# Patient Record
Sex: Female | Born: 1955 | Race: White | Hispanic: No | Smoking: Never smoker
Health system: Southern US, Community
[De-identification: ages and names within clinical notes are randomized; demographics above are authoritative.]

## PROBLEM LIST (undated history)

## (undated) DIAGNOSIS — R296 Repeated falls: Secondary | ICD-10-CM

## (undated) DIAGNOSIS — E7800 Pure hypercholesterolemia, unspecified: Secondary | ICD-10-CM

## (undated) DIAGNOSIS — I959 Hypotension, unspecified: Secondary | ICD-10-CM

## (undated) DIAGNOSIS — O2230 Deep phlebothrombosis in pregnancy, unspecified trimester: Secondary | ICD-10-CM

## (undated) DIAGNOSIS — F32A Depression, unspecified: Secondary | ICD-10-CM

## (undated) DIAGNOSIS — S42309A Unspecified fracture of shaft of humerus, unspecified arm, initial encounter for closed fracture: Secondary | ICD-10-CM

## (undated) DIAGNOSIS — K469 Unspecified abdominal hernia without obstruction or gangrene: Secondary | ICD-10-CM

## (undated) DIAGNOSIS — K259 Gastric ulcer, unspecified as acute or chronic, without hemorrhage or perforation: Secondary | ICD-10-CM

## (undated) DIAGNOSIS — F419 Anxiety disorder, unspecified: Secondary | ICD-10-CM

## (undated) DIAGNOSIS — G4700 Insomnia, unspecified: Secondary | ICD-10-CM

## (undated) DIAGNOSIS — IMO0002 Reserved for concepts with insufficient information to code with codable children: Secondary | ICD-10-CM

## (undated) DIAGNOSIS — K219 Gastro-esophageal reflux disease without esophagitis: Secondary | ICD-10-CM

## (undated) DIAGNOSIS — F329 Major depressive disorder, single episode, unspecified: Secondary | ICD-10-CM

## (undated) HISTORY — PX: TUBAL LIGATION: SHX77

## (undated) HISTORY — PX: BACK SURGERY: SHX140

## (undated) HISTORY — PX: OTHER SURGICAL HISTORY: SHX169

## (undated) HISTORY — PX: FOOT SURGERY: SHX648

## (undated) HISTORY — PX: KNEE SURGERY: SHX244

## (undated) HISTORY — PX: ABDOMINAL HYSTERECTOMY: SHX81

---

## 2001-07-01 ENCOUNTER — Encounter: Payer: Self-pay | Admitting: Orthopedic Surgery

## 2001-07-01 ENCOUNTER — Inpatient Hospital Stay (HOSPITAL_COMMUNITY): Admission: EM | Admit: 2001-07-01 | Discharge: 2001-07-05 | Payer: Self-pay | Admitting: Emergency Medicine

## 2001-09-27 DIAGNOSIS — F329 Major depressive disorder, single episode, unspecified: Secondary | ICD-10-CM | POA: Insufficient documentation

## 2002-04-24 ENCOUNTER — Emergency Department (HOSPITAL_COMMUNITY): Admission: EM | Admit: 2002-04-24 | Discharge: 2002-04-24 | Payer: Self-pay | Admitting: Emergency Medicine

## 2003-01-05 ENCOUNTER — Emergency Department (HOSPITAL_COMMUNITY): Admission: EM | Admit: 2003-01-05 | Discharge: 2003-01-05 | Payer: Self-pay | Admitting: Emergency Medicine

## 2004-04-28 ENCOUNTER — Encounter (HOSPITAL_COMMUNITY): Admission: RE | Admit: 2004-04-28 | Discharge: 2004-07-27 | Payer: Self-pay | Admitting: Internal Medicine

## 2004-07-01 ENCOUNTER — Emergency Department (HOSPITAL_COMMUNITY): Admission: EM | Admit: 2004-07-01 | Discharge: 2004-07-01 | Payer: Self-pay | Admitting: Family Medicine

## 2004-07-09 ENCOUNTER — Emergency Department (HOSPITAL_COMMUNITY): Admission: EM | Admit: 2004-07-09 | Discharge: 2004-07-09 | Payer: Self-pay | Admitting: Emergency Medicine

## 2004-07-20 ENCOUNTER — Encounter: Admission: RE | Admit: 2004-07-20 | Discharge: 2004-07-20 | Payer: Self-pay | Admitting: Sports Medicine

## 2004-08-14 ENCOUNTER — Ambulatory Visit (HOSPITAL_COMMUNITY): Admission: RE | Admit: 2004-08-14 | Discharge: 2004-08-14 | Payer: Self-pay | Admitting: Sports Medicine

## 2004-08-19 ENCOUNTER — Ambulatory Visit (HOSPITAL_COMMUNITY): Admission: RE | Admit: 2004-08-19 | Discharge: 2004-08-19 | Payer: Self-pay | Admitting: Interventional Radiology

## 2004-08-19 ENCOUNTER — Encounter (INDEPENDENT_AMBULATORY_CARE_PROVIDER_SITE_OTHER): Payer: Self-pay | Admitting: Specialist

## 2005-01-17 ENCOUNTER — Ambulatory Visit: Payer: Self-pay | Admitting: Psychiatry

## 2005-01-17 ENCOUNTER — Emergency Department (HOSPITAL_COMMUNITY): Admission: EM | Admit: 2005-01-17 | Discharge: 2005-01-17 | Payer: Self-pay | Admitting: Emergency Medicine

## 2005-01-17 ENCOUNTER — Inpatient Hospital Stay (HOSPITAL_COMMUNITY): Admission: AD | Admit: 2005-01-17 | Discharge: 2005-01-20 | Payer: Self-pay | Admitting: Psychiatry

## 2005-01-18 ENCOUNTER — Ambulatory Visit (HOSPITAL_COMMUNITY): Admission: RE | Admit: 2005-01-18 | Discharge: 2005-01-18 | Payer: Self-pay | Admitting: Psychiatry

## 2005-04-15 ENCOUNTER — Encounter: Admission: RE | Admit: 2005-04-15 | Discharge: 2005-04-15 | Payer: Self-pay | Admitting: Sports Medicine

## 2005-06-25 ENCOUNTER — Ambulatory Visit: Payer: Self-pay | Admitting: Internal Medicine

## 2005-07-01 ENCOUNTER — Ambulatory Visit: Payer: Self-pay | Admitting: Psychiatry

## 2005-07-01 ENCOUNTER — Emergency Department (HOSPITAL_COMMUNITY): Admission: EM | Admit: 2005-07-01 | Discharge: 2005-07-01 | Payer: Self-pay | Admitting: Emergency Medicine

## 2005-07-01 ENCOUNTER — Inpatient Hospital Stay (HOSPITAL_COMMUNITY): Admission: RE | Admit: 2005-07-01 | Discharge: 2005-07-03 | Payer: Self-pay | Admitting: Psychiatry

## 2005-07-09 ENCOUNTER — Inpatient Hospital Stay (HOSPITAL_COMMUNITY): Admission: RE | Admit: 2005-07-09 | Discharge: 2005-07-11 | Payer: Self-pay | Admitting: Orthopedic Surgery

## 2005-09-28 ENCOUNTER — Encounter: Admission: RE | Admit: 2005-09-28 | Discharge: 2005-09-28 | Payer: Self-pay | Admitting: Sports Medicine

## 2006-09-13 ENCOUNTER — Ambulatory Visit: Payer: Self-pay | Admitting: Pulmonary Disease

## 2006-09-13 ENCOUNTER — Inpatient Hospital Stay (HOSPITAL_COMMUNITY): Admission: AC | Admit: 2006-09-13 | Discharge: 2006-09-19 | Payer: Self-pay

## 2006-09-21 ENCOUNTER — Ambulatory Visit: Payer: Self-pay | Admitting: Gastroenterology

## 2010-04-26 ENCOUNTER — Emergency Department (HOSPITAL_COMMUNITY): Admission: EM | Admit: 2010-04-26 | Discharge: 2010-04-26 | Payer: Self-pay | Admitting: Emergency Medicine

## 2010-05-28 DIAGNOSIS — Z95818 Presence of other cardiac implants and grafts: Secondary | ICD-10-CM

## 2010-06-09 ENCOUNTER — Ambulatory Visit: Payer: Self-pay | Admitting: Gastroenterology

## 2010-06-09 ENCOUNTER — Inpatient Hospital Stay (HOSPITAL_COMMUNITY)
Admission: EM | Admit: 2010-06-09 | Discharge: 2010-06-30 | Disposition: A | Discharge status: Other Acute Inpatient Hospital | Payer: Self-pay | Source: Home / Self Care | Admitting: Emergency Medicine

## 2010-06-09 ENCOUNTER — Ambulatory Visit: Payer: Self-pay | Admitting: Internal Medicine

## 2010-06-13 ENCOUNTER — Ambulatory Visit: Payer: Self-pay | Admitting: Surgery

## 2010-06-15 ENCOUNTER — Encounter (INDEPENDENT_AMBULATORY_CARE_PROVIDER_SITE_OTHER): Payer: Self-pay | Admitting: Internal Medicine

## 2010-06-20 ENCOUNTER — Encounter: Payer: Self-pay | Admitting: Gastroenterology

## 2010-06-30 ENCOUNTER — Ambulatory Visit: Payer: Self-pay | Admitting: Psychiatry

## 2010-06-30 ENCOUNTER — Inpatient Hospital Stay (HOSPITAL_COMMUNITY): Admission: AD | Admit: 2010-06-30 | Discharge: 2010-07-13 | Payer: Self-pay | Admitting: Psychiatry

## 2010-07-03 DIAGNOSIS — K921 Melena: Secondary | ICD-10-CM

## 2010-07-04 ENCOUNTER — Ambulatory Visit: Payer: Self-pay | Admitting: Vascular Surgery

## 2010-07-04 ENCOUNTER — Emergency Department (HOSPITAL_COMMUNITY): Admission: EM | Admit: 2010-07-04 | Discharge: 2010-07-04 | Payer: Self-pay | Admitting: Emergency Medicine

## 2010-07-04 ENCOUNTER — Encounter (INDEPENDENT_AMBULATORY_CARE_PROVIDER_SITE_OTHER): Payer: Self-pay | Admitting: Emergency Medicine

## 2010-07-04 DIAGNOSIS — I82409 Acute embolism and thrombosis of unspecified deep veins of unspecified lower extremity: Secondary | ICD-10-CM | POA: Insufficient documentation

## 2010-07-06 ENCOUNTER — Telehealth (INDEPENDENT_AMBULATORY_CARE_PROVIDER_SITE_OTHER): Payer: Self-pay | Admitting: *Deleted

## 2010-07-15 ENCOUNTER — Ambulatory Visit: Payer: Self-pay | Admitting: Internal Medicine

## 2010-07-15 ENCOUNTER — Encounter (INDEPENDENT_AMBULATORY_CARE_PROVIDER_SITE_OTHER): Payer: Self-pay | Admitting: Nurse Practitioner

## 2010-07-20 ENCOUNTER — Ambulatory Visit: Payer: Self-pay | Admitting: Nurse Practitioner

## 2010-07-20 DIAGNOSIS — Z9889 Other specified postprocedural states: Secondary | ICD-10-CM | POA: Insufficient documentation

## 2010-07-20 DIAGNOSIS — E8941 Symptomatic postprocedural ovarian failure: Secondary | ICD-10-CM

## 2010-07-29 ENCOUNTER — Encounter (INDEPENDENT_AMBULATORY_CARE_PROVIDER_SITE_OTHER): Payer: Self-pay | Admitting: Internal Medicine

## 2010-07-29 ENCOUNTER — Encounter (INDEPENDENT_AMBULATORY_CARE_PROVIDER_SITE_OTHER): Payer: Self-pay | Admitting: Nurse Practitioner

## 2010-07-31 ENCOUNTER — Encounter (INDEPENDENT_AMBULATORY_CARE_PROVIDER_SITE_OTHER): Payer: Self-pay | Admitting: Nurse Practitioner

## 2010-08-07 ENCOUNTER — Encounter: Payer: Self-pay | Admitting: Nurse Practitioner

## 2010-08-07 LAB — CONVERTED CEMR LAB: INR: 2.53 — ABNORMAL HIGH (ref <1.50)

## 2010-08-10 ENCOUNTER — Telehealth (INDEPENDENT_AMBULATORY_CARE_PROVIDER_SITE_OTHER): Payer: Self-pay | Admitting: Nurse Practitioner

## 2010-08-11 ENCOUNTER — Ambulatory Visit: Payer: Self-pay | Admitting: Psychiatry

## 2010-08-14 ENCOUNTER — Telehealth (INDEPENDENT_AMBULATORY_CARE_PROVIDER_SITE_OTHER): Payer: Self-pay | Admitting: Nurse Practitioner

## 2010-08-14 ENCOUNTER — Encounter (INDEPENDENT_AMBULATORY_CARE_PROVIDER_SITE_OTHER): Payer: Self-pay | Admitting: Nurse Practitioner

## 2010-08-17 ENCOUNTER — Telehealth (INDEPENDENT_AMBULATORY_CARE_PROVIDER_SITE_OTHER): Payer: Self-pay | Admitting: Nurse Practitioner

## 2010-08-19 ENCOUNTER — Encounter (INDEPENDENT_AMBULATORY_CARE_PROVIDER_SITE_OTHER): Payer: Self-pay | Admitting: Nurse Practitioner

## 2010-08-24 ENCOUNTER — Encounter: Payer: Self-pay | Admitting: Nurse Practitioner

## 2010-08-24 ENCOUNTER — Telehealth (INDEPENDENT_AMBULATORY_CARE_PROVIDER_SITE_OTHER): Payer: Self-pay | Admitting: Nurse Practitioner

## 2010-08-25 ENCOUNTER — Ambulatory Visit: Payer: Self-pay | Admitting: Nurse Practitioner

## 2010-08-25 DIAGNOSIS — R197 Diarrhea, unspecified: Secondary | ICD-10-CM

## 2010-08-25 DIAGNOSIS — E860 Dehydration: Secondary | ICD-10-CM

## 2010-08-25 LAB — CONVERTED CEMR LAB
Glucose, Urine, Semiquant: NEGATIVE
Protein, U semiquant: >=300
Urobilinogen, UA: 0.2
pH: 6

## 2010-09-08 ENCOUNTER — Encounter (INDEPENDENT_AMBULATORY_CARE_PROVIDER_SITE_OTHER): Payer: Self-pay | Admitting: Family Medicine

## 2010-09-08 ENCOUNTER — Ambulatory Visit: Payer: Self-pay | Admitting: Nurse Practitioner

## 2010-09-08 LAB — CONVERTED CEMR LAB

## 2010-09-09 ENCOUNTER — Encounter: Payer: Self-pay | Admitting: Gastroenterology

## 2010-09-10 ENCOUNTER — Encounter (INDEPENDENT_AMBULATORY_CARE_PROVIDER_SITE_OTHER): Payer: Self-pay | Admitting: Family Medicine

## 2010-09-11 ENCOUNTER — Encounter (INDEPENDENT_AMBULATORY_CARE_PROVIDER_SITE_OTHER): Payer: Self-pay | Admitting: Nurse Practitioner

## 2010-09-11 ENCOUNTER — Emergency Department (HOSPITAL_COMMUNITY)
Admission: EM | Admit: 2010-09-11 | Discharge: 2010-09-11 | Payer: Self-pay | Source: Home / Self Care | Admitting: Emergency Medicine

## 2010-09-11 ENCOUNTER — Encounter (INDEPENDENT_AMBULATORY_CARE_PROVIDER_SITE_OTHER): Payer: Self-pay | Admitting: Family Medicine

## 2010-09-11 LAB — CONVERTED CEMR LAB: Prothrombin Time: 97.1 s — ABNORMAL HIGH (ref 11.6–15.2)

## 2010-09-12 ENCOUNTER — Emergency Department (HOSPITAL_COMMUNITY)
Admission: EM | Admit: 2010-09-12 | Discharge: 2010-09-12 | Payer: Self-pay | Source: Home / Self Care | Admitting: Emergency Medicine

## 2010-09-17 ENCOUNTER — Encounter (INDEPENDENT_AMBULATORY_CARE_PROVIDER_SITE_OTHER): Payer: Self-pay | Admitting: Nurse Practitioner

## 2010-09-22 ENCOUNTER — Encounter: Payer: Self-pay | Admitting: Nurse Practitioner

## 2010-09-23 ENCOUNTER — Telehealth (INDEPENDENT_AMBULATORY_CARE_PROVIDER_SITE_OTHER): Payer: Self-pay | Admitting: Nurse Practitioner

## 2010-10-18 ENCOUNTER — Encounter: Payer: Self-pay | Admitting: Internal Medicine

## 2010-10-24 IMAGING — CT CT HEAD W/O CM
4 of 7 series · 15 of 47 positions shown, 17 images · non-contrast
Comparison: Unenhanced cranial CT 07/01/2005 and 01/18/2005.

CT HEAD

CLINICAL DATA: Patient found in the woods after having been
missing the past 9 days.  Poorly responsive.  Now intubated.

CT HEAD WITHOUT CONTRAST
CT CERVICAL SPINE WITHOUT CONTRAST
TECHNIQUE: Multidetector CT imaging of the head and cervical spine
was performed following the standard protocol without intravenous
contrast.  Multiplanar CT image reconstructions of the cervical
spine were also generated.

[Series 3: recon 2: brain · axial · 0.47mm/px · z∈[-67,+46]mm · 6 of 72 slices shown, 8 images]
[im 11/72  brain]
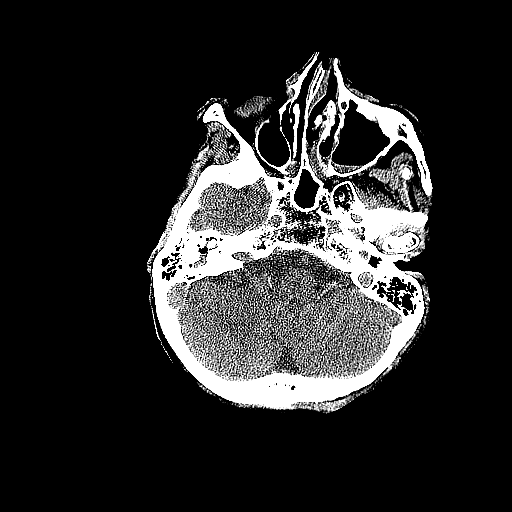
[im 11/72  bone]
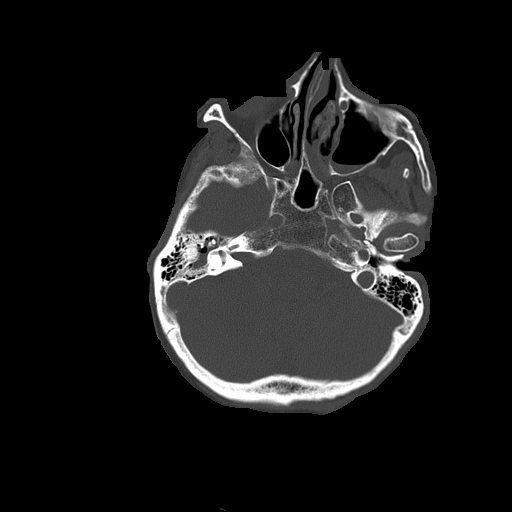
[im 21/72  brain]
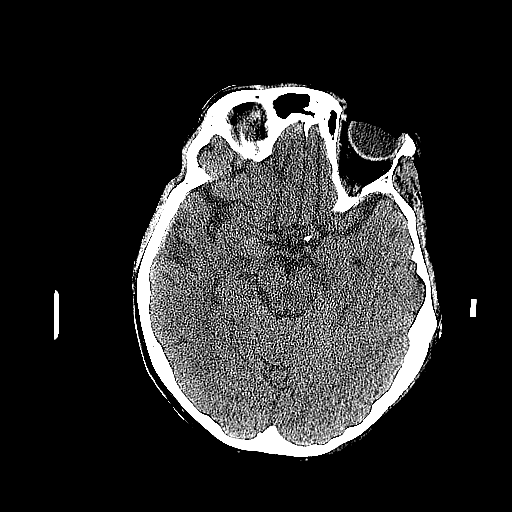
[im 31/72  brain]
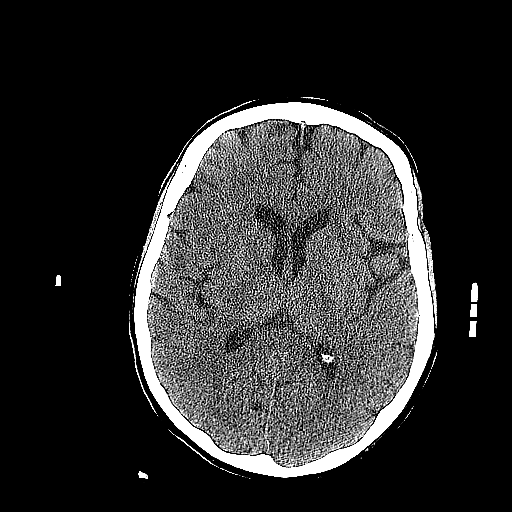
[im 41/72  brain]
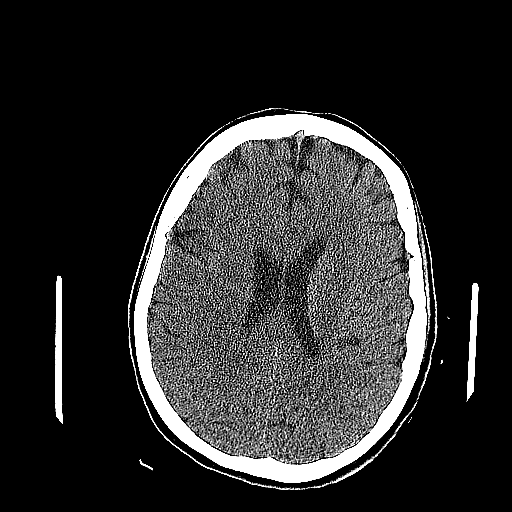
[im 51/72  brain]
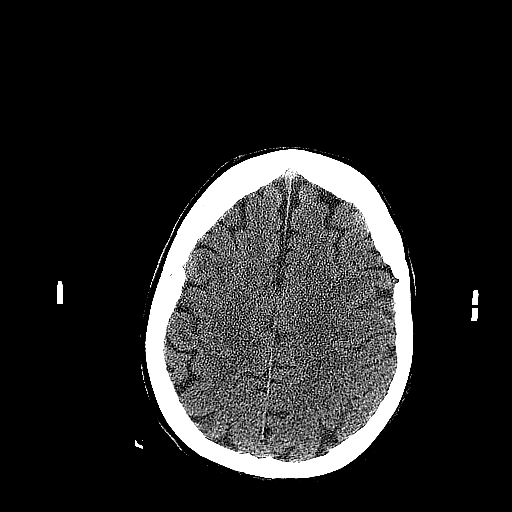
[im 51/72  bone]
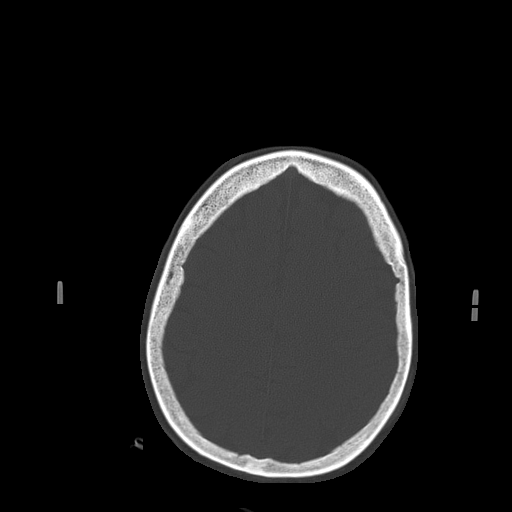
[im 61/72  brain]
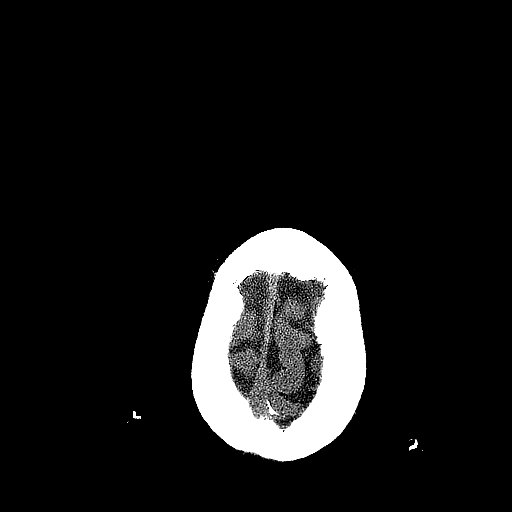

[Series 600: sagittal · sagittal · 0.33mm/px · 3 of 29 slices shown]
[im 10/29  brain]
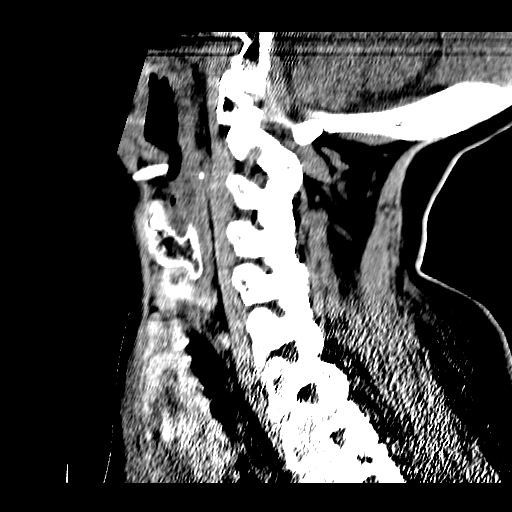
[im 15/29  brain]
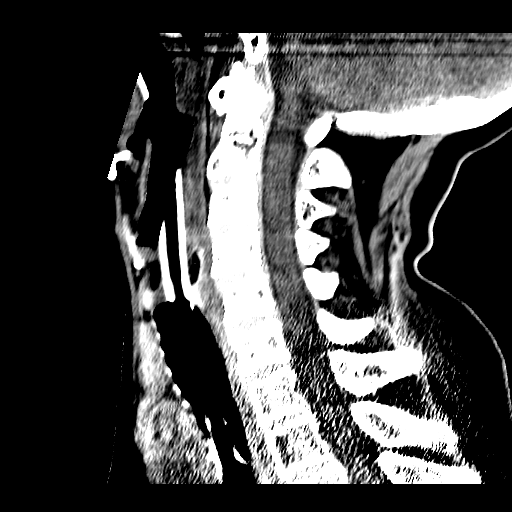
[im 19/29  brain]
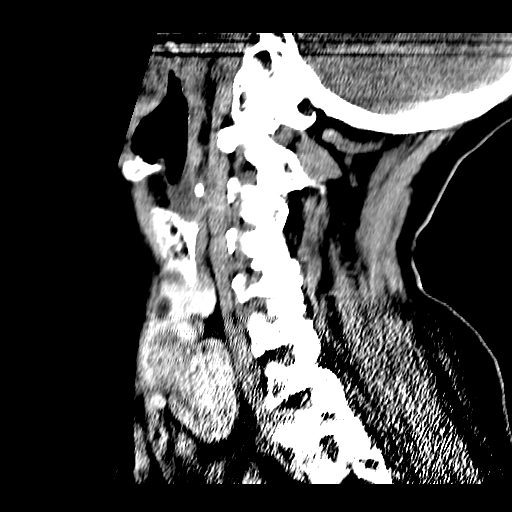

[Series 601: coronal · coronal · 0.33mm/px · 3 of 30 slices shown]
[im 10/30  brain]
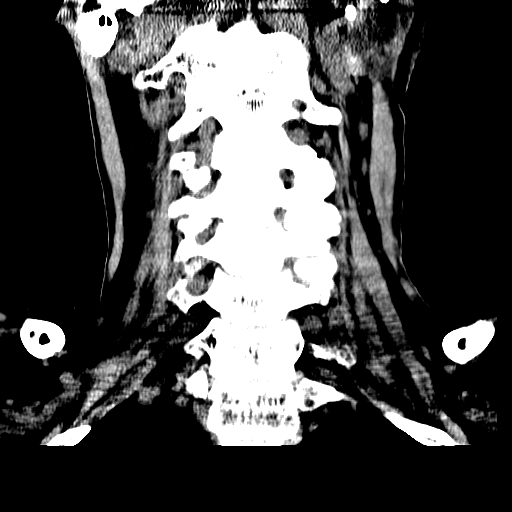
[im 13/30  brain]
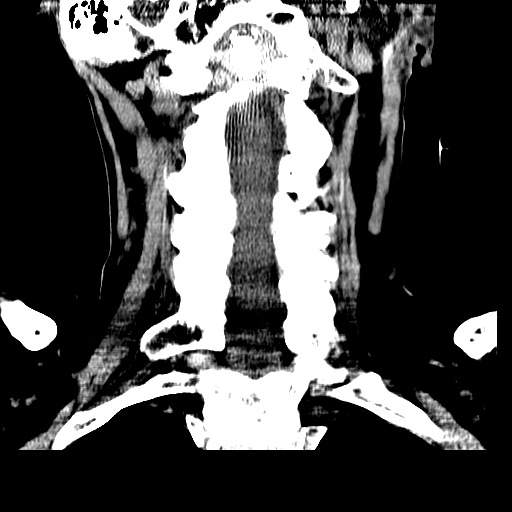
[im 17/30  brain]
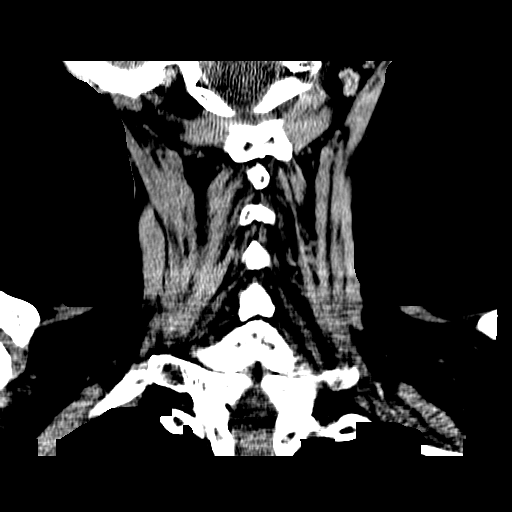

[Series 602: orthogonal · axial · 0.33mm/px · z∈[-265,-195]mm · 3 of 52 slices shown]
[im 13/52  brain]
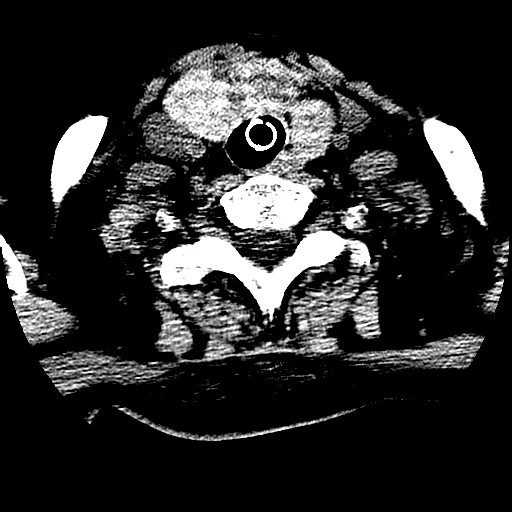
[im 26/52  brain]
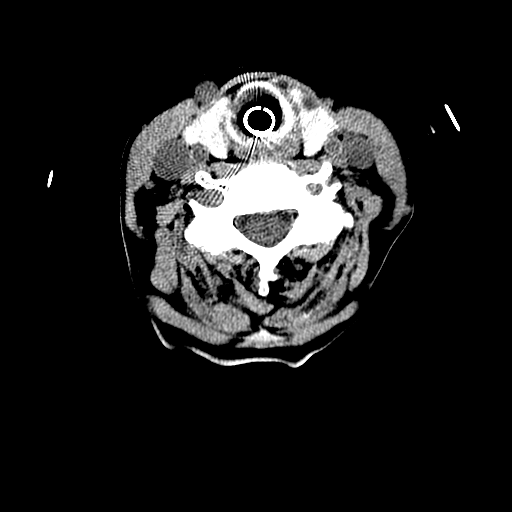
[im 39/52  brain]
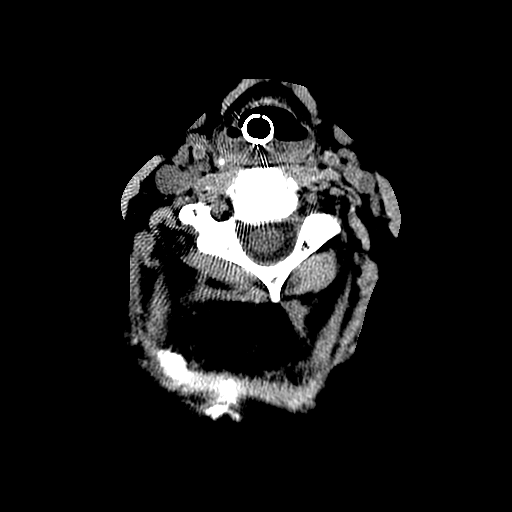

[15 of 47 positions shown; findings below may reference images not displayed]

FINDINGS: Head tilt in the gantry accounts for apparent asymmetry
in the cerebral hemispheres.  Ventricular system normal in size and
appearance for age.  No mass lesion.  No midline shift.  No acute
hemorrhage or hematoma.  No extra-axial fluid collections.  No
evidence of acute infarction.  No significant interval change in
the appearance of brain.

No skull fractures or other focal osseous abnormalities involving
the skull.  Opacification of bilateral ethmoid air cells.  Air-
fluid level in the left maxillary sinus and associated mucous
retention cyst or polyp.  Fluid in both middle ear cavities.
Severe bony nasal septal deviation to the right.  Mastoid air cells
well-aerated.  Early minimal bilateral carotid siphon
atherosclerosis.
IMPRESSION: 1.  No acute intracranial abnormality.
2.  Acute superimposed upon chronic left maxillary sinusitis.
Chronic bilateral ethmoid sinusitis.
3.  Bilateral otitis media.

CT CERVICAL SPINE
FINDINGS: Soft tissue window images demonstrate multinodular
goiter.  No gross cervical spine disc protrusions.  No fractures
identified involving the cervical spine.  Head rotated to the left.
Sagittal reconstructed images demonstrate anatomic posterior
alignment.  Disc spaces well preserved.  No spinal stenosis.  Facet
joints intact with diffuse degenerative changes.  Coronal
reformatted images demonstrate intact craniocervical junction,
intact C1-C2 articulation, intact dens, and intact lateral masses
throughout.  Predominately facet hypertrophy accounts for
multilevel foraminal stenoses including severe right and moderate
left C3-4, severe left and moderate right C4-5, and mild bilateral
C5-6.
IMPRESSION: 1.  No cervical spine fractures.
2.  Diffuse facet degenerative changes resulting in multilevel
foraminal stenoses as detailed above.
3.  Multinodular goiter.

## 2010-10-24 IMAGING — CR DG HIP COMPLETE 2+V*R*
3 series · 3 of 3 positions shown · non-contrast
Comparison: None

CLINICAL DATA: Right hip pain.

RIGHT HIP - COMPLETE 2+ VIEW

[AP (1 of 3)]
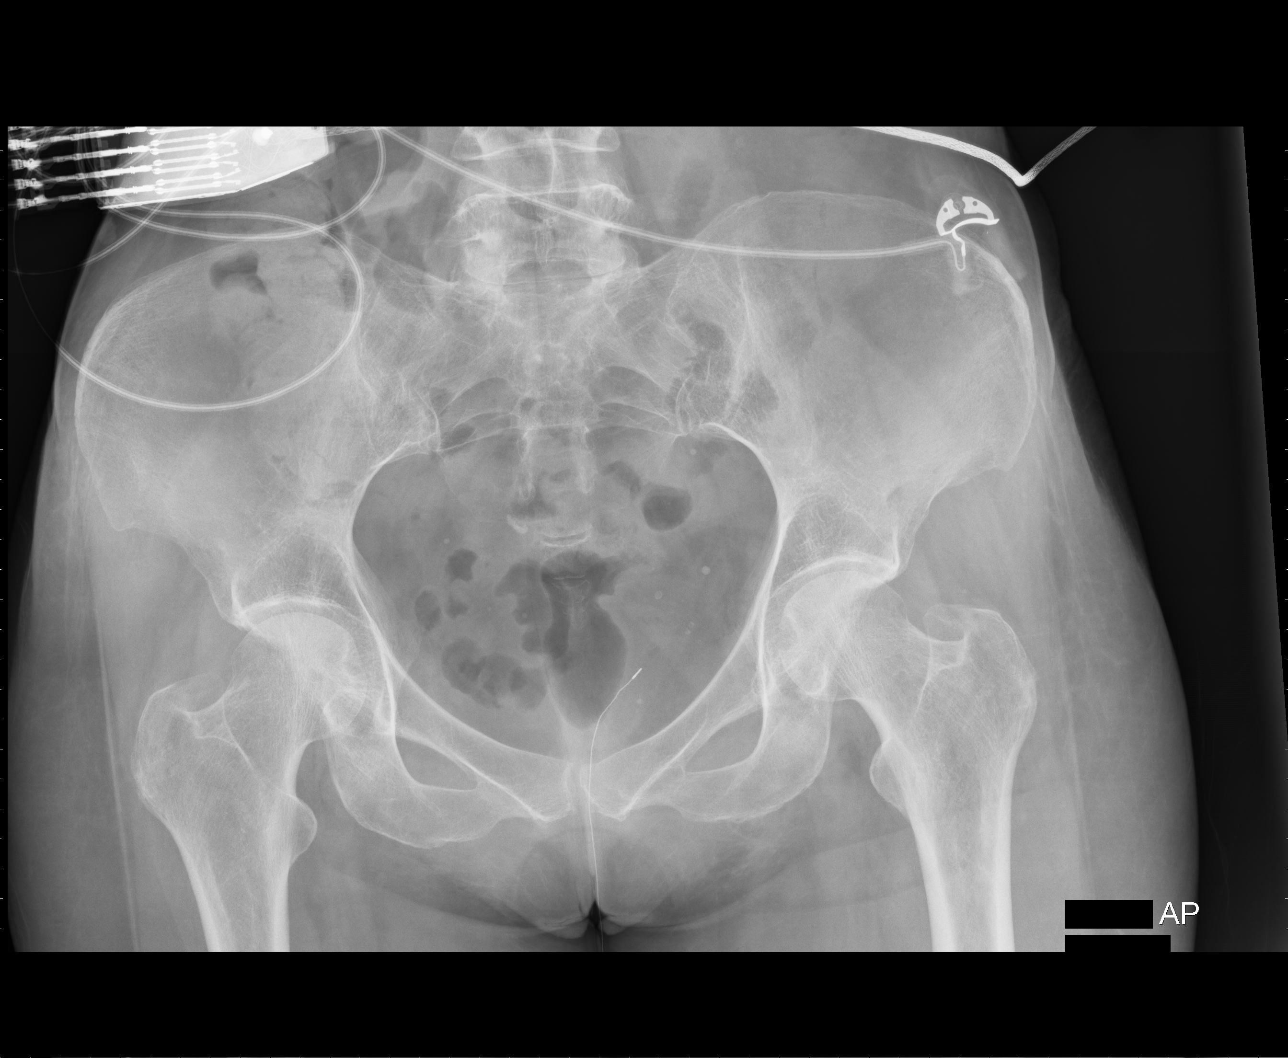

[AP (2 of 3)]
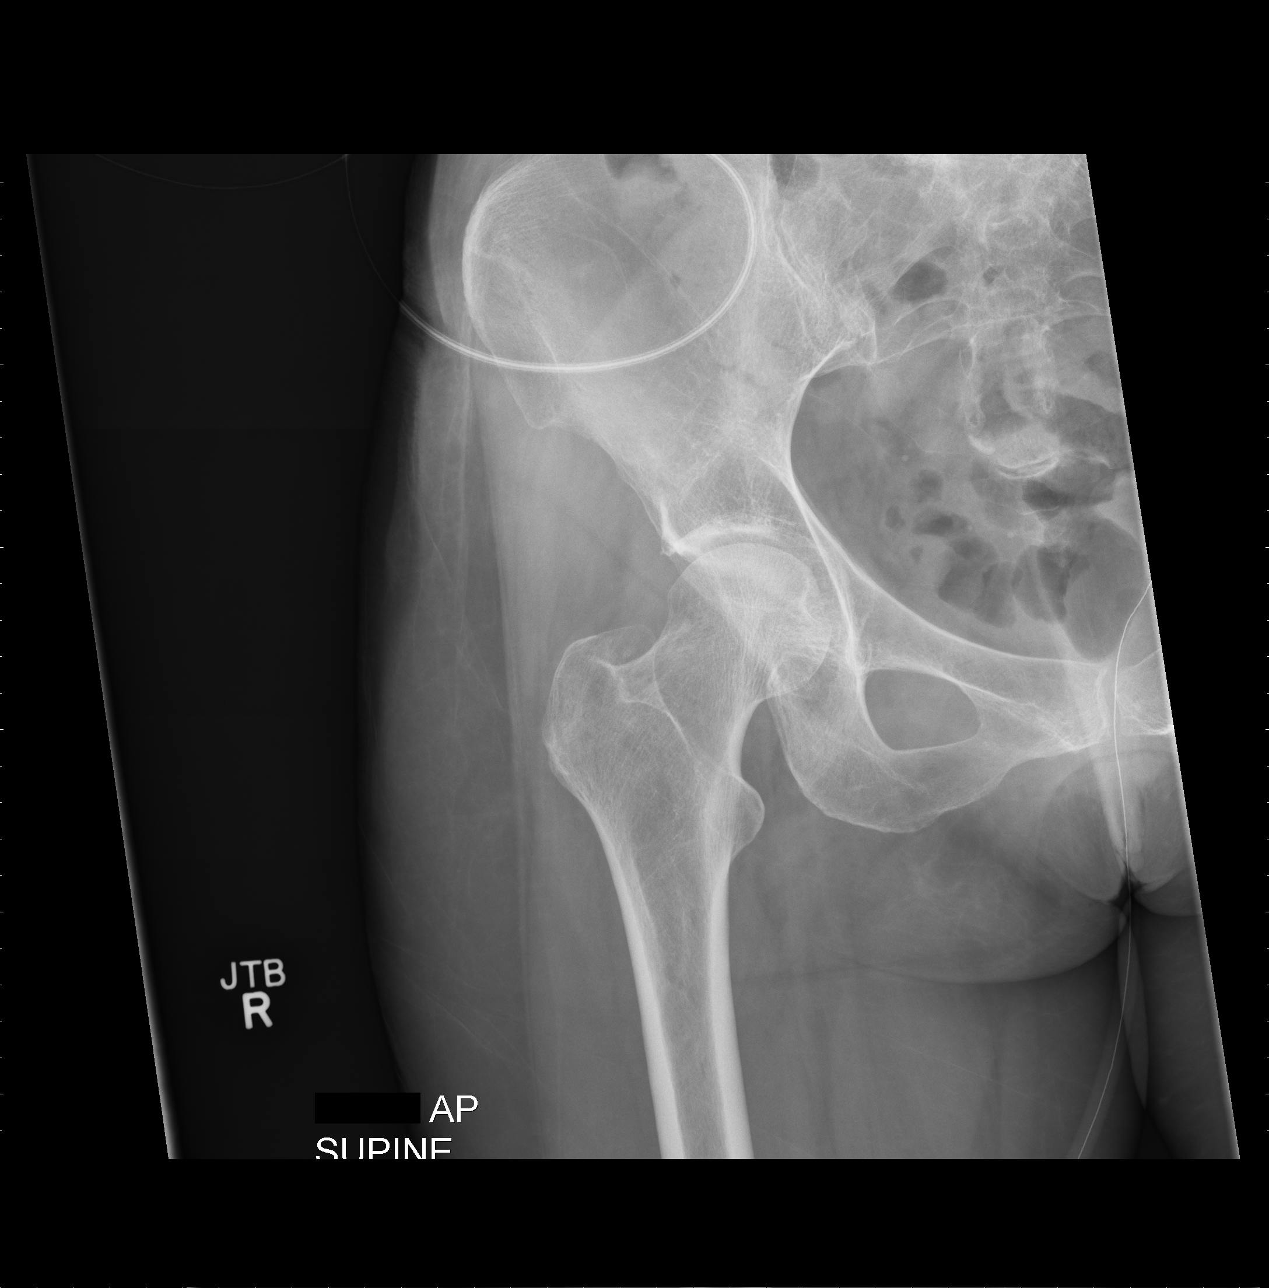

[AP (3 of 3)]
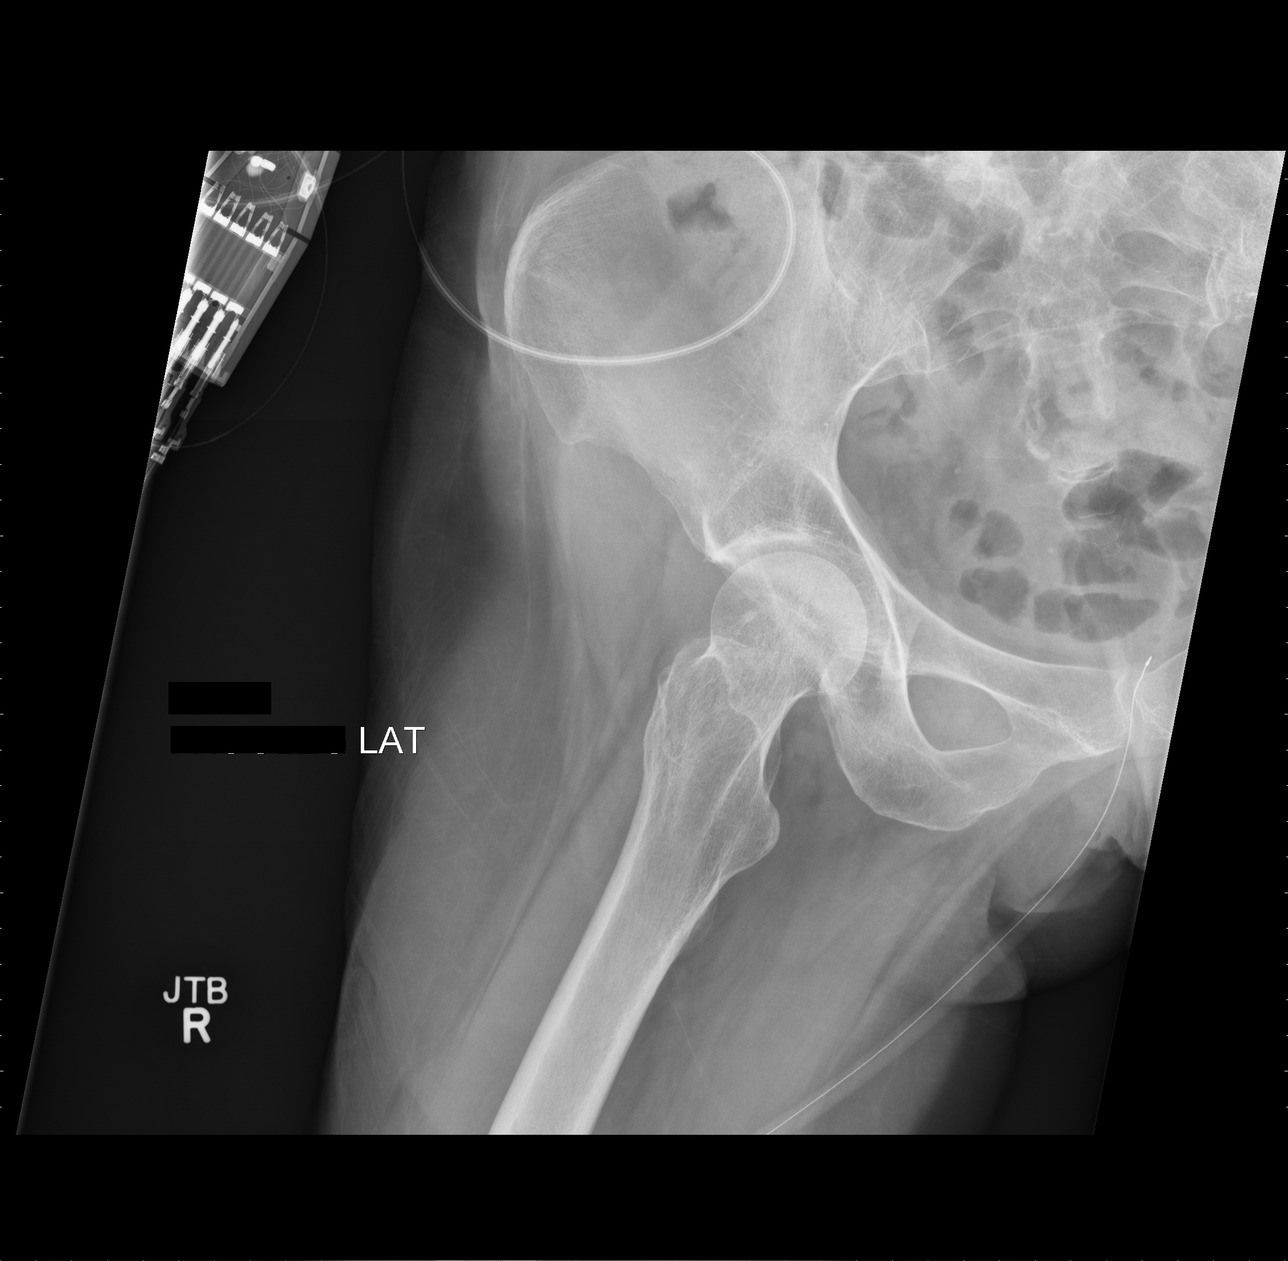

[3 of 3 positions shown; findings below may reference images not displayed]

FINDINGS: There is no evidence of acute fracture, subluxation or
dislocation.
No significant degenerative changes are identified.
No focal bony lesions are identified.
IMPRESSION: No evidence of acute bony abnormality.

## 2010-10-27 NOTE — Letter (Signed)
Summary: ANTICOAGULATION DOSING  ANTICOAGULATION DOSING   Imported By: Arta Bruce 08/10/2010 09:52:40  _____________________________________________________________________  External Attachment:    Type:   Image     Comment:   External Document

## 2010-10-27 NOTE — Letter (Signed)
Summary: PT INFORMATION SHEET  PT INFORMATION SHEET   Imported By: Arta Bruce 07/20/2010 11:40:19  _____________________________________________________________________  External Attachment:    Type:   Image     Comment:   External Document

## 2010-10-27 NOTE — Progress Notes (Signed)
Summary: diarrhea  Phone Note Call from Patient   Caller: Patient Summary of Call: Diarrhea for 3 weeks now having some bleeding when she wipes and a "blop" of blood in toilet. Advised to stay with clear liquids for 24 hrs progress to [employer] May use immodium as directed on box. Soak in warm tub if rectal area irritated. She had a colonoscopy recently which she said she was fine. Advised to call us if bleeding increases  and diarrhea continues. Initial call taken by: Gaylyn Cheers RN,  August 17, 2010 3:58 PM

## 2010-10-27 NOTE — Procedures (Signed)
Summary: Colonoscopy  Patient: Christina [name] Note: All result statuses are Final unless otherwise noted.  Tests: (1) Colonoscopy (COL)   COL Colonoscopy           DONE     [facility]     [address]     [place], [place]  [address]           COLONOSCOPY PROCEDURE REPORT           PATIENT:  Christina, [name]  MR#:  [id number]     BIRTHDATE:  1956-06-12, 54 yrs. old  GENDER:  female     ENDOSCOPIST:  Barbette Hair. Arlyce Dice, MD     REF. BY:     PROCEDURE DATE:  06/20/2010     PROCEDURE:  Diagnostic Colonoscopy     ASA CLASS:  Class III     INDICATIONS:  heme positive stool     MEDICATIONS:   Fentanyl 75 mcg IV, Versed 5 mg IV, Benadryl 25 mg     IV           DESCRIPTION OF PROCEDURE:   After the risks benefits and     alternatives of the procedure were thoroughly explained, informed     consent was obtained.  Digital rectal exam was performed and     revealed no abnormalities.   The Pentax Colonoscope [id number] and     [id number] endoscope was introduced through the anus and     advanced to the cecum, which was identified by both the appendix     and ileocecal valve, without limitations.  The quality of the prep     was excellent, using MiraLax.  The instrument was then slowly     withdrawn as the colon was fully examined.     <<PROCEDUREIMAGES>>           FINDINGS:  A normal appearing cecum, ileocecal valve, and     appendiceal orifice were identified. The ascending, hepatic     flexure, transverse, splenic flexure, descending, sigmoid colon,     and rectum appeared unremarkable (see image002, image003,     image004, image007, image008, and image009).   Retroflexed views     in the rectum revealed no abnormalities.    The scope was then     withdrawn from the patient and the procedure completed.           COMPLICATIONS:  None     ENDOSCOPIC IMPRESSION:     1) Normal colon     RECOMMENDATIONS:     1) followup hemeoccults     REPEAT EXAM:  No        ______________________________     Barbette Hair. Arlyce Dice, MD           CC:  Jeani Hawking, MD           n.     Rosalie Doctor:   Barbette Hair. Kaplan at 06/20/2010 12:23 PM           Denson, Darl Pikes, [phone]  Note: An exclamation mark (!) indicates a result that was not dispersed into the flowsheet. Document Creation Date: 06/20/2010 12:23 PM _______________________________________________________________________  (1) Order result status: Final Collection or observation date-time: 06/20/2010 12:20 Requested date-time:  Receipt date-time:  Reported date-time:  Referring Physician:   Ordering Physician: Melvia Heaps [phone]) Specimen Source:  Source: Launa Grill Order Number: [id number] Lab site:   Appended Document: Orders Update    Clinical Lists Changes  Problems: Added new  problem of HEMOCCULT POSITIVE STOOL (ICD-578.1) Orders: Added new Test order of [facility] Hemoccult Cards #3 (take home) (Hem cards #3) - Signed

## 2010-10-27 NOTE — Letter (Signed)
Summary: Handout Printed  Printed Handout:  - Coumadin Instructions 

## 2010-10-27 NOTE — Letter (Signed)
Summary: ANTICOAGULATION DOSING  ANTICOAGULATION DOSING   Imported By: Arta Bruce 08/24/2010 16:06:51  _____________________________________________________________________  External Attachment:    Type:   Image     Comment:   External Document

## 2010-10-27 NOTE — Letter (Signed)
Summary: MAILED REQUESTED RECORDS TO [facility]  MAILED REQUESTED RECORDS TO [facility]   Imported By: Arta Bruce 07/31/2010 11:35:33  _____________________________________________________________________  External Attachment:    Type:   Image     Comment:   External Document

## 2010-10-27 NOTE — Assessment & Plan Note (Signed)
Summary: Acute - Diarrhea   Vital Signs:  Patient profile:   55 year old female Menstrual status:  hysterectomy Weight:      98.5 pounds BMI:     16.97 Temp:     97.4 degrees F oral Pulse rate:   110 / minute Pulse rhythm:   regular Resp:     20 per minute BP sitting:   98 / 70  (left arm) Cuff size:   regular  Vitals Entered By: Levon Hedger (August 25, 2010 10:59 AM) CC: diarrhea x 12 days Is Patient Diabetic? No Pain Assessment Patient in pain? yes     Location: back, stomach Intensity: 8, 4  Does patient need assistance? Functional Status Self care Ambulation Normal     Menstrual Status hysterectomy   CC:  diarrhea x 12 days.  History of Present Illness:  Pt into the office for f/u on Diarrhea She has had diarrhea for 12 days +bloody -mucous -fever +abdominal cramps She has been eating the BRAT diet upon instruction from this office She has currently taking in only clear liquids +Weight loss (down from 115 to 98 pounds in 1 month) Last colonscopy done 05/2010 She was on antibiotics while in the hospital for pneumonia prior to her last visit here C.Diff during the hospital stay was negative -aches in joints  No previous episodes of diarrhea that has lasted for this long  Allergies (verified): No Known Drug Allergies  Review of Systems General:  Denies fever. CV:  Denies chest pain or discomfort. Resp:  Denies cough. GI:  Complains of abdominal pain, bloody stools, and diarrhea; denies nausea and vomiting.  Physical Exam  General:  alert.   Head:  normocephalic.   Lungs:  normal breath sounds.   Heart:  normal rate and regular rhythm.   Abdomen:  flat nontender Msk:  up to the exam table Neurologic:  alert & oriented X3.   Skin:  decreased skin turgor Psych:  Oriented X3.     Impression & Recommendations:  Problem # 1:  DIARRHEA, ACUTE (ICD-787.91) containers given for stool: C. diff, fecal leuks, O&P, culture unable to start IVF  in office pt instructed to get gatorade at home Her updated medication list for this problem includes:    Florastor 250 Mg Caps (Saccharomyces boulardii) ..... One tab by mouth daily  Orders: UA Dipstick w/o Micro (manual) (02725) CT with Contrast (CT w/ contrast)  Problem # 2:  DEHYDRATION (ICD-276.51) unable to start IVF pt given option of going to short stay for IVF but she declined  Complete Medication List: 1)  Proventil Hfa 108 (90 Base) Mcg/act Aers (Albuterol sulfate) .... Two puffs inhaled every four hours as needed 2)  Ambien 10 Mg Tabs (Zolpidem tartrate) .... One tab by mouth at bedtime 3)  Abilify 5 Mg Tabs (Aripiprazole) .... One half (1/2) tab by mouth daily at bedtime 4)  Celexa 40 Mg Tabs (Citalopram hydrobromide) .... One tab by mouth daily 5)  Clonazepam 1 Mg Tabs (Clonazepam) .... One tab by mouth daily at bedtime 6)  Ferrous Sulfate 325 (65 Fe) Mg Tabs (Ferrous sulfate) .... One tab by mouth twice daily with meals 7)  Florastor 250 Mg Caps (Saccharomyces boulardii) .... One tab by mouth daily 8)  Coumadin 5 Mg Tabs (Warfarin sodium) .... Sunday - 1.5 tabs, monday - 1 tab, tuesday - 1.5 tabs, wednesday - 1.5 tabs, thursday - 1 tab, friday - 1.5 tabs, saturday - 1.5 tabs 9)  Tramadol Hcl 50 Mg  Tabs (Tramadol hcl) .... One tablet by mouth daily as needed for pain 10)  Prednisone (pak) 10 Mg Tabs (Prednisone) .... Taper from 60mg  to 0mg  over 1 week 11)  Metronidazole 500 Mg Tabs (Metronidazole) .... One tablet by mouth three times a day for infection  Patient Instructions: 1)  Prednisone taper 2)  Tuesday - 6 tabs 3)  Wednesday - 5 tabs 4)  Thursday - 4 tabs 5)  Friday - 3 tabs 6)  Saturday - 2 tabs 7)  Sunday - 1 tabs 8)  also take the metronidazole 500mg  by mouth three times a day  9)  Drink gatorade to get some electrolytes in your system 10)  Bring stool cards back to this office as soon as possible 11)  Triage visit on Monday to assess diarrhea and review CT  results Prescriptions: METRONIDAZOLE 500 MG TABS (METRONIDAZOLE) One tablet by mouth three times a day for infection  #30 x 0   Entered and Authorized by:   Lehman Prom FNP   Signed by:   Lehman Prom FNP on 08/25/2010   Method used:   Print then Give to Patient   RxID:   [id number] PREDNISONE (PAK) 10 MG TABS (PREDNISONE) Taper from 60mg  to 0mg  over 1 week  #21 x 0   Entered and Authorized by:   Lehman Prom FNP   Signed by:   Lehman Prom FNP on 08/25/2010   Method used:   Print then Give to Patient   RxID:   [id number]    Orders Added: 1)  Est. Patient Level III [40102] 2)  UA Dipstick w/o Micro (manual) [81002] 3)  CT with Contrast [CT w/ contrast]    Laboratory Results   Urine Tests  Date/Time Received: August 25, 2010 11:10 AM   Routine Urinalysis   Color: lt. yellow Appearance: Clear Glucose: negative   (Normal Range: Negative) Bilirubin: moderate   (Normal Range: Negative) Ketone: moderate (40)   (Normal Range: Negative) Spec. Gravity: 1.015   (Normal Range: 1.003-1.035) Blood: moderate   (Normal Range: Negative) pH: 6.0   (Normal Range: 5.0-8.0) Protein: >=300   (Normal Range: Negative) Urobilinogen: 0.2   (Normal Range: 0-1) Nitrite: negative   (Normal Range: Negative) Leukocyte Esterace: negative   (Normal Range: Negative)

## 2010-10-27 NOTE — Miscellaneous (Signed)
Summary: Coumadin Clinic   Anticoagulation Management History:      Warfarin therapy is being given due to the first episode of deep venous thrombosis and/or pulmonary embolism.  Positive risk factors for bleeding include history of GI bleeding.  Negative risk factors for bleeding include an age less than 53 years old, no history of CVA/TIA, and absence of serious comorbidities.  The bleeding index is 'intermediate risk'.  Negative CHADS2 values include History of CHF, History of HTN, Age > 61 years old, History of Diabetes, and Prior Stroke/CVA/TIA.  The start date was 07/04/2010.  Anticipated length of treatment is 3-6 months.  Her last INR was 2.53 and today's INR is 5.0.    Anticoagulation Management Assessment/Plan:      The patient's current anticoagulation dose is Coumadin 5 mg tabs: Sunday - 1.5 tabs, Monday - 1 tab, Tuesday - 1.5 tabs, Wednesday - 1.5 tabs, Thursday - 1 tab, Friday - 1.5 tabs, Saturday - 1.5 tabs.  The target INR is 2.0-3.0.  The next INR is due 08/26/2010.         Current Anticoagulation Instructions: hold x 2 then 5mg  daily with exception of 7.5 on thursday Clinical Lists Changes  Observations: Added new observation of SAT. DOSE: 1 tab (08/24/2010 14:48) Added new observation of FRIDAY DOSE: 1 tab (08/24/2010 14:48) Added new observation of THURS. DOSE: 1.5 tabs (08/24/2010 14:48) Added new observation of WEDS. DOSE: 1 tab (08/24/2010 14:48) Added new observation of TUESDAY DOSE: 1 tab (08/24/2010 14:48) Added new observation of MONDAY DOSE: 1 tab (08/24/2010 14:48) Added new observation of COUM TAB MG: 5mg  (08/24/2010 14:48) Added new observation of SUNDAY DOSE: 1 tab (08/24/2010 14:48) Added new observation of INSTRUCT C: hold x 2 then 5mg  daily with exception of 7.5 on thursday (08/24/2010 14:48) Added new observation of INR: 5.0  (08/24/2010 14:48) Added new observation of NEXT PT: 08/26/2010 days (08/24/2010 14:48) Added new observation of COUM DOSE:  8^8^4^1^6.60^6^3  (08/24/2010 14:48) Added new observation of COUMADIN CHG: -25  (08/24/2010 14:48) Added new observation of COUM TOT WK: 37.50 mg  (08/24/2010 14:48)

## 2010-10-27 NOTE — Progress Notes (Signed)
Summary: NEED TO SPEAK WITH THE NURSE  Phone Note Call from Patient Call back at [phone]   Summary of Call: PATIENT NEED TO SPEAK WIHT THE NURSE, SHE WAS AT THE HOSPITAL FOR 3 WEEKS WITH NEMONIA ,AND KNOW SHE IS WHIT A SORETROATH,COUGH,FEVER. SHE NEED SOME QUESTIONS. Initial call taken by: Domenic Polite,  August 10, 2010 8:13 AM  Follow-up for Phone Call        Pneumonia cleared up, but is aware of relapse symptoms. Feels like it's just a cold.  Since Thursday, has had a productive cough, clear mucus, very sore throat and low-grade fever.  Denies chest congestion.  Doesn't know if it's serious enough for a visit.  Denies nausea/vomiting, is hydrating without difficulty.  Advised re cold protocol -- verbalized understanding. Will monitor for severity and if symptoms persist, will call back. Follow-up by: Dutch Quint RN,  August 10, 2010 9:51 AM

## 2010-10-27 NOTE — Letter (Signed)
Summary: ANTICOAGULATION DOSING  ANTICOAGULATION DOSING   Imported By: Arta Bruce 08/18/2010 10:28:42  _____________________________________________________________________  External Attachment:    Type:   Image     Comment:   External Document

## 2010-10-27 NOTE — Assessment & Plan Note (Signed)
Summary: NEW - Hospital F/u   Vital Signs:  Patient profile:   55 year old female Height:      64 inches Weight:      115 pounds BMI:     19.81 Temp:     97.8 degrees F oral Pulse rate:   72 / minute Pulse rhythm:   regular Resp:     18 per minute BP sitting:   96 / 80  (left arm) Cuff size:   regular  Vitals Entered By: [place] Shannon (July 20, 2010 11:39 AM)  History of Present Illness:  Pt into the office to establish care. no previous PCP  Previously admitted to [facility] from 06/30/2010 to 07/13/2010 (full d/c reviewed) Pt was admitted to [facility] (second admission) after transferring from medical unit (admission 06/09/2010 to 06/29/2010) for fatal overdose.  Pt apparently took near 79 Klonopin and wondered into the woods hoping to not be found.  Pt was found her in the woods after being missed for 9 days.  She required oral tracheal intubation and mechanical ventilatory support.  She was noted to be in renal failure. Pt admits that she has been severely depressed since the death of her husband 7 years ago. no history of substance abuse  GI bleed - negative colonscopy.  2 units of PRBC's given and pt stabalized  Pt develped left lower extremity DVT while in behavioral health. [place] filter placed No previous epidsodes of DVT previously No Tobacco use No oral contraceptives    Anticoagulation Management History:      She is being anticoagulated because of the first episode of deep venous thrombosis and/or pulmonary embolism.  Positive risk factors for bleeding include history of GI bleeding.  Negative risk factors for bleeding include an age less than 69 years old, no history of CVA/TIA, and absence of serious comorbidities.  The bleeding index is 'intermediate risk'.  Negative CHADS2 values include History of CHF, History of HTN, Age > 62 years old, History of Diabetes, and Prior Stroke/CVA/TIA.  The start date was 07/04/2010.  Anticipated length of treatment is 3-6  months.  Her last INR was 2.17.     Habits & Providers  Alcohol-Tobacco-Diet     Alcohol drinks/day: 0     Tobacco Status: never  Exercise-Depression-Behavior     Have you felt down or hopeless? yes     Have you felt little pleasure in things? yes     Depression Counseling: further diagnostic testing and/or other treatment is indicated     Drug Use: never  Comments: pt has been [facility] to [facility] - [facility]  Current Medications (verified): 1)  Proventil Hfa 108 (90 Base) Mcg/act Aers (Albuterol Sulfate) .... Two Puffs Inhaled Every Four Hours As Needed 2)  Ambien 10 Mg Tabs (Zolpidem Tartrate) .... One Tab By Mouth At Bedtime 3)  Abilify 5 Mg Tabs (Aripiprazole) .... One Half (1/2) Tab By Mouth Daily At Bedtime 4)  Celexa 40 Mg Tabs (Citalopram Hydrobromide) .... One Tab By Mouth Daily 5)  Clonazepam 1 Mg Tabs (Clonazepam) .... One Tab By Mouth Daily At Bedtime 6)  Ferrous Sulfate 325 (65 Fe) Mg Tabs (Ferrous Sulfate) .... One Tab By Mouth Twice Daily With Meals 7)  Oxycodone Hcl 5 Mg Tabs (Oxycodone Hcl) .... One Tab By Mouth Twice As Needed 8)  Florastor 250 Mg Caps (Saccharomyces Boulardii) .... One Tab By Mouth Daily 9)  Warfarin Sodium 5 Mg Tabs (Warfarin  Sodium) .... One Tab 5mg  Monday and Thursday, 1.5 Tabs All Other Days  Allergies (verified): No Known Drug Allergies  Past History:  Past Surgical History: Hysterectomy 1987 right knee arthroscopic left humerus fracture with hardward placed left foot surgery x 3  Family History: mother - htn (alive), TIA father - prostate cancer (deceased) sister - Multiple sclerosis  Social History: widow 2 children No ETOH  No tobacco  No drugSmoking Status:  never Drug Use:  never  Review of Systems General:  Denies loss of appetite. CV:  Denies chest pain or discomfort. Resp:  Denies cough. GI:  Denies abdominal pain, nausea, and vomiting. MS:  left leg  pain.  Physical Exam  General:  alert.   Head:  normocephalic.   Ears:  ear piercing(s) noted.   Pulses:  left DP pulse +1 Extremities:  left lower extremity edema +1 Neurologic:  alert & oriented X3.   Skin:  scrapes on bil knees - s/p fall Psych:  flat affect   Impression & Recommendations:  Problem # 1:  DVT (ICD-453.40) pt goes to coumadin clinic - meds managed there ivc filter placed advised pt that some meds may interfere with coumadin - would not like to continue oxycodone. so will try tramadol in limited amounts  Problem # 2:  DEPRESSION, SEVERE (ICD-311) Keep appointment with new Klaira Pesci  Her updated medication list for this problem includes:    Celexa 40 Mg Tabs (Citalopram hydrobromide) ..... One tab by mouth daily    Clonazepam 1 Mg Tabs (Clonazepam) ..... One tab by mouth daily at bedtime  Complete Medication List: 1)  Proventil Hfa 108 (90 Base) Mcg/act Aers (Albuterol sulfate) .... Two puffs inhaled every four hours as needed 2)  Ambien 10 Mg Tabs (Zolpidem tartrate) .... One tab by mouth at bedtime 3)  Abilify 5 Mg Tabs (Aripiprazole) .... One half (1/2) tab by mouth daily at bedtime 4)  Celexa 40 Mg Tabs (Citalopram hydrobromide) .... One tab by mouth daily 5)  Clonazepam 1 Mg Tabs (Clonazepam) .... One tab by mouth daily at bedtime 6)  Ferrous Sulfate 325 (65 Fe) Mg Tabs (Ferrous sulfate) .... One tab by mouth twice daily with meals 7)  Florastor 250 Mg Caps (Saccharomyces boulardii) .... One tab by mouth daily 8)  Coumadin 5 Mg Tabs (Warfarin sodium) .... Sunday - 1.5 tabs, monday - 1 tab, tuesday - 1.5 tabs, wednesday - 1.5 tabs, thursday - 1 tab, friday - 1.5 tabs, saturday - 1.5 tabs 9)  Tramadol Hcl 50 Mg Tabs (Tramadol hcl) .... One tablet by mouth daily as needed for pain  Anticoagulation Management Assessment/Plan:      The patient's current anticoagulation dose is Coumadin 5 mg tabs: Sunday - 1.5 tabs, Monday - 1 tab, Tuesday - 1.5 tabs, Wednesday -  1.5 tabs, Thursday - 1 tab, Friday - 1.5 tabs, Saturday - 1.5 tabs.  The target INR is 2.0-3.0.         Current Anticoagulation Instructions: Warfarin sodium 5 mg tabs: ONE TAB 5MG  MONDAY AND THURSDAY, 1.5 TABS ALL OTHER DAYS Coumadin 5 mg tabs: Sunday - 1.5 tabs, Monday - 1 tab, Tuesday - 1.5 tabs, Wednesday - 1.5 tabs, Thursday - 1 tab, Friday - 1.5 tabs, Saturday - 1.5 tabs.    Patient Instructions: 1)  Keep your appointment at the coumadin clinic on November 2nd. 2)  They will check your labs and regulate your coumadin. 3)  Keep appointment with mental health Harace Mccluney as scheduled. 4)  Follow  up in 2 months for a complete physical exam. 5)  No food after midnight before this appointment. 6)  Will need vaginal exam, Mammogram, tdap, phq-9, u/a. Prescriptions: TRAMADOL HCL 50 MG TABS (TRAMADOL HCL) One tablet by mouth daily as needed for pain  #30 x 0   Entered and Authorized by:   Lehman Prom FNP   Signed by:   Lehman Prom FNP on 07/20/2010   Method used:   Print then Give to Patient   RxID:   [id number]    Orders Added: 1)  New Patient Level III [36644]

## 2010-10-27 NOTE — Letter (Signed)
Summary: MAILED REQUESTED RECORDS TO [facility]  MAILED REQUESTED RECORDS TO [facility]   Imported By: Arta Bruce 08/14/2010 12:00:48  _____________________________________________________________________  External Attachment:    Type:   Image     Comment:   External Document

## 2010-10-27 NOTE — Progress Notes (Signed)
Summary: QUESTION ABOUT MEDICINE  Phone Note Call from Patient Call back at [phone]   Summary of Call: PATIENT HAS A QUESTION ABOUT HER MEDICINE PLEASE CALL HER AT [phone] Initial call taken by: Domenic Polite,  August 14, 2010 3:49 PM  Follow-up for Phone Call        Pt. had question about refills on Coumadin. She is scheduled to have PT drawn on Wed. and will meet with Pharmacist who will determine dosage needed. She has enough medication to last until Wed. Follow-up by: Gaylyn Cheers RN,  August 14, 2010 5:14 PM

## 2010-10-27 NOTE — Progress Notes (Signed)
Summary: DIARRHEA FOR 11 DAYS  Phone Note Call from Patient Call back at Home Phone [phone]   Reason for Call: Talk to Nurse Summary of Call: MARTIN PT. MS Royals CALLED, BECASUE SHE STILL HAD DIARHEA, AND ITS BEEN GOING ON NOW FOR 11 DAYS, HER STOMACH IS CRAMPLING CRAMPING AND AND SHE IS NOW SEEING BLOOD. Initial call taken by: Leodis Rains,  August 24, 2010 8:45 AM  Follow-up for Phone Call        See prior phone note re diarrhea.  Has done liquids, then BRAT, diarrhea has been consistent.  Is having excruciating abdominal cramping and diarrhea, is very weak.  States she is having stools every ten minutes, no form to the stools, just water and sees blood in the stools.  Denies fever.  Is still passing blood in her stools, less than last week, but blood is still present.  Denies nausea/vomiting.  Had started Wellbutrin the morning that the diarrhea first appeared.  Is still taking Wellbutrin.  Is having slight pain when she finishes urinating, is not voiding as much as normal, denies odor, appearance of urine is normal, not darker.  Spoke with Selena Batten, pt. had refused offer of appointment earlier today. Follow-up by: Dutch Quint RN,  August 24, 2010 11:01 AM  Additional Follow-up for Phone Call Additional follow up Details #1::        check for availabilities in the schedule with me in the next 1-2 days - if not then she will need to be triaged into your scheduled Additional Follow-up by: Lehman Prom FNP,  August 24, 2010 2:47 PM    Additional Follow-up for Phone Call Additional follow up Details #2::    Appt. 08/25/10-had no transportation today.  Dutch Quint RN  August 24, 2010 4:46 PM

## 2010-10-27 NOTE — Letter (Signed)
Summary: Handout Printed  Printed Handout:  - Crohn's Disease 

## 2010-10-27 NOTE — Letter (Signed)
Summary: ANTICOAGULATION DOSING  ANTICOAGULATION DOSING   Imported By: Arta Bruce 08/18/2010 10:31:32  _____________________________________________________________________  External Attachment:    Type:   Image     Comment:   External Document

## 2010-10-27 NOTE — Progress Notes (Signed)
  Phone Note Other Incoming   Request: Send information Summary of Call: Request for records received from [facility]. Request forwarded to [facility].     

## 2010-10-29 NOTE — Miscellaneous (Signed)
Summary: Coumadin clinic update   Anticoagulation Management History:      The patient is taking warfarin and comes in today for a routine follow up visit.  Warfarin therapy is being given due to the first episode of deep venous thrombosis and/or pulmonary embolism.  Positive risk factors for bleeding include history of GI bleeding.  Negative risk factors for bleeding include an age less than 53 years old, no history of CVA/TIA, and absence of serious comorbidities.  The bleeding index is 'intermediate risk'.  Negative CHADS2 values include History of CHF, History of HTN, Age > 22 years old, History of Diabetes, and Prior Stroke/CVA/TIA.  The start date was 07/04/2010.  Anticipated length of treatment is 3-6 months.  Her last INR was 13.52 and today's INR is 1.5.  Prothrombin time is 19.6.    Anticoagulation Management Assessment/Plan:      The patient's current anticoagulation dose is Coumadin 5 mg tabs: Sunday - 1 tab, Monday - 0.5 tab, Tuesday - 1 tab, Wednesday - 0.5 tab, Thursday - 1 tab, Friday - 0.5 tab, Saturday - 1 tab.  The target INR is 2.0-3.0.  The next INR is due 09/22/2010.         Current Anticoagulation Instructions: The patient's dosage of coumadin will be increased.  The new dosage includes:  Coumadin 5 mg tabs: Sunday - 1 tab, Monday - 0.5 tab, Tuesday - 1 tab, Wednesday - 0.5 tab, Thursday - 1 tab, Friday - 0.5 tab, Saturday - 1 tab.   Clinical Lists Changes  Medications: Added new medication of COUMADIN 5 MG TABS (WARFARIN SODIUM) Sunday - 1 tab, Monday - 0.5 tab, Tuesday - 1 tab, Wednesday - 0.5 tab, Thursday - 1 tab, Friday - 0.5 tab, Saturday - 1 tab Removed medication of COUMADIN 5 MG TABS (WARFARIN SODIUM) Sunday - 1.5 tabs, Monday - 1 tab, Tuesday - 1.5 tabs, Wednesday - 1.5 tabs, Thursday - 1 tab, Friday - 1.5 tabs, Saturday - 1.5 tabs Observations: Added new observation of NEXT PT: 09/22/2010 (09/22/2010 15:53) Added new observation of INSTRUCT C: The patient's dosage  of coumadin will be increased.  The new dosage includes:  Coumadin 5 mg tabs: Sunday - 1 tab, Monday - 0.5 tab, Tuesday - 1 tab, Wednesday - 0.5 tab, Thursday - 1 tab, Friday - 0.5 tab, Saturday - 1 tab.   (09/22/2010 15:53) Added new observation of SAT. DOSE: 1 tab (09/22/2010 15:53) Added new observation of FRIDAY DOSE: 0.5 tab (09/22/2010 15:53) Added new observation of THURS. DOSE: 1 tab (09/22/2010 15:53) Added new observation of WEDS. DOSE: 0.5 tab (09/22/2010 15:53) Added new observation of TUESDAY DOSE: 1 tab (09/22/2010 15:53) Added new observation of SUNDAY DOSE: 1 tab (09/22/2010 15:53) Added new observation of COUM TAB MG: 5mg  (09/22/2010 15:53) Added new observation of MONDAY DOSE: 0.5 tab (09/22/2010 15:53) Added new observation of PT PATIENT: 19.6 s (09/22/2010 15:53) Added new observation of INR: 1.5  (09/22/2010 15:53) Added new observation of COUM DOSE: 5^2.[id number].[id number]^5  (09/22/2010 15:53) Added new observation of COUMADIN CHG: -27  (09/22/2010 15:53) Added new observation of COUM TOT WK: 27.50 mg  (09/22/2010 15:53)

## 2010-10-29 NOTE — Letter (Signed)
Summary: MESSAGE FROM [facility]  MESSAGE FROM [facility]   Imported By: Arta Bruce 09/23/2010 16:40:57  _____________________________________________________________________  External Attachment:    Type:   Image     Comment:   External Document

## 2010-10-29 NOTE — Letter (Signed)
Summary: ANTICOAGULATION DOSING SERVICE  ANTICOAGULATION DOSING SERVICE   Imported By: Arta Bruce 09/22/2010 16:39:10  _____________________________________________________________________  External Attachment:    Type:   Image     Comment:   External Document

## 2010-10-29 NOTE — Letter (Signed)
Summary: CMA Hemoccult Letter  [facility]  [address] [place], [place] [address]   Phone: [phone]  Fax: [phone]         September 09, 2010 MRN: [id number]    Christina [name] [address] [place], [place]  [address]    Dear Christina [name],     I have been unable to reach you by phone. At your last visit, Dr.Kaplan requested that you complete the hemoccult cards given to you at your last visit. However, as of yet, we have not received them. Please follow the instructions on the inside cover and return them as soon as possible.If you have misplaced the hemoccult cards, please call me at [phone] and I will mail you new cards. Your health is very important to [place].These tests will help ensure that Dr. Arlyce Dice has all the information at his disposal to make a complete diagnosis for you.  Thank you for your prompt attention to this matter.   Sincerely,    Selinda Michaels RN

## 2010-10-29 NOTE — Progress Notes (Signed)
Summary: Needs an appointment  Phone Note Outgoing Call   Summary of Call: Call this pt - she needs a f/u appt with me at next available for diarrhea and i spoke with coumadin clinic about her reports of ongoing rectal bleeding. I sent word by the pharmacist to have her make an appt with me but i don't see that she did cpe scheduled on 09/08/2010 but pt did not come to the appt Initial call taken by: Lehman Prom FNP,  September 23, 2010 4:22 PM  Follow-up for Phone Call        Got denial letter from [facility] - needs to set up eligibility appointment.  Advised to call [employer] and make appointment for eligibility.  States that diarrhea has resolved and she no longer has rectal bleeding.  Advised to call for appointment when she gets eligibility.  Dutch Quint RN  September 23, 2010 6:07 PM

## 2010-10-29 NOTE — Progress Notes (Signed)
Summary: DR Theresa Mulligan TO Joline Maxcy  Phone Note Other Incoming Call back at [phone]   Caller: DR. Leone Haven, PSYCHIATRIST Summary of Call: MARTIN PT. DR RICHARDSON CALLED BECAUSE SHE IS CONCERNED  ABIOUT MS Corey, BECAUSE SHE IS STILL LOSING A LOT OF WEIGHT, AND HAVING A LOT OF DIARRHEA. DR Senaida Ores SAYS THAT MS Goyer WILL NEED TO COME IN TO BE SEEN, AND THAT SHE IS BASICALLY SKIN AND BONES.    DR Senaida Ores IS THE ACTING PSYCHIATRIST THAT IS ASSOCIATED WITH PSYCHO THERAPY IN [place]. SHE SAYS THAT SHE WOULD LIKE TO SPEAK WITH YOU. Initial call taken by: Leodis Rains,  September 23, 2010 3:15 PM  Follow-up for Phone Call        Sent to N. Daphine Deutscher.  Dutch Quint RN  September 23, 2010 3:43 PM   Additional Follow-up for Phone Call Additional follow up Details #1::        called number as above. Spoke with Dr. Senaida Ores Informed her that pt has not been to f/u as scheduled.  Pt is part of the ACT team and they have been multidisciplinary in their approach.  She intends to have contact with the primary care provider Pt  has been to coumadin clinic but still not following directions as ordered.  Will have triage nurse call pt and schedule appt. Additional Follow-up by: Lehman Prom FNP,  September 23, 2010 4:15 PM

## 2010-10-30 ENCOUNTER — Encounter (INDEPENDENT_AMBULATORY_CARE_PROVIDER_SITE_OTHER): Payer: Self-pay | Admitting: Nurse Practitioner

## 2010-11-04 NOTE — Letter (Signed)
Summary: MAILED REQUESTED RECORDS TO [facility]  MAILED REQUESTED RECORDS TO [facility]   Imported By: Arta Bruce 10/30/2010 15:59:05  _____________________________________________________________________  External Attachment:    Type:   Image     Comment:   External Document

## 2010-11-18 ENCOUNTER — Encounter: Payer: Self-pay | Admitting: Gastroenterology

## 2010-11-24 NOTE — Miscellaneous (Signed)
Summary: Hemoccult Stool x3  Clinical Lists Changes Patient given Hemoccult Cards x3 on 07/03/2010. On 09/09/10, Selinda Michaels, RN sent a reminder letter to patient to complete the tests. No response from patient as of yet. Patient is being followed by Dr Dow Adolph and Lehman Prom, FNP. Ok per Dr Arlyce Dice to cancel the Hemoccult order.

## 2010-12-07 LAB — COMPREHENSIVE METABOLIC PANEL
ALT: <8 U/L (ref 0–35)
Alkaline Phosphatase: 66 U/L (ref 39–117)
BUN: 3 mg/dL — ABNORMAL LOW (ref 6–23)
CO2: 25 mEq/L (ref 19–32)
Chloride: 101 mEq/L (ref 96–112)
GFR calc non Af Amer: >60 mL/min (ref >60)
Glucose, Bld: 41 mg/dL — CL (ref 70–99)
Potassium: 3.6 mEq/L (ref 3.5–5.1)
Sodium: 137 mEq/L (ref 135–145)
Total Bilirubin: 0.7 mg/dL (ref 0.3–1.2)
Total Protein: 6 g/dL (ref 6.0–8.3)

## 2010-12-07 LAB — CBC
HCT: 39.2 % (ref 36.0–46.0)
HCT: 44.1 % (ref 36.0–46.0)
Hemoglobin: 12.7 g/dL (ref 12.0–15.0)
Hemoglobin: 14.1 g/dL (ref 12.0–15.0)
MCH: 29.1 pg (ref 26.0–34.0)
MCHC: 32.4 g/dL (ref 30.0–36.0)
MCV: 89.6 fL (ref 78.0–100.0)
RDW: 14.4 % (ref 11.5–15.5)
WBC: 5.3 10*3/uL (ref 4.0–10.5)

## 2010-12-07 LAB — GLUCOSE, CAPILLARY: Glucose-Capillary: 115 mg/dL — ABNORMAL HIGH (ref 70–99)

## 2010-12-07 LAB — DIFFERENTIAL
Basophils Absolute: 0.1 10*3/uL (ref 0.0–0.1)
Basophils Relative: 1 % (ref 0–1)
Eosinophils Absolute: 0.2 10*3/uL (ref 0.0–0.7)
Neutro Abs: 3.5 10*3/uL (ref 1.7–7.7)
Neutrophils Relative %: 66 % (ref 43–77)

## 2010-12-07 LAB — PROTIME-INR
Prothrombin Time: 15.9 seconds — ABNORMAL HIGH (ref 11.6–15.2)
Prothrombin Time: 54.6 seconds — ABNORMAL HIGH (ref 11.6–15.2)

## 2010-12-07 LAB — CROSSMATCH
ABO/RH(D): A POS
Antibody Screen: NEGATIVE

## 2010-12-09 LAB — CBC
HCT: 32.4 % — ABNORMAL LOW (ref 36.0–46.0)
Hemoglobin: 10.9 g/dL — ABNORMAL LOW (ref 12.0–15.0)
Hemoglobin: 11.4 g/dL — ABNORMAL LOW (ref 12.0–15.0)
Hemoglobin: 11.8 g/dL — ABNORMAL LOW (ref 12.0–15.0)
MCH: 29.9 pg (ref 26.0–34.0)
MCH: 30.2 pg (ref 26.0–34.0)
MCH: 31.1 pg (ref 26.0–34.0)
MCHC: 31.2 g/dL (ref 30.0–36.0)
MCHC: 32 g/dL (ref 30.0–36.0)
MCHC: 32.6 g/dL (ref 30.0–36.0)
MCV: 96.9 fL (ref 78.0–100.0)
MCV: 98.4 fL (ref 78.0–100.0)
Platelets: 253 10*3/uL (ref 150–400)
Platelets: 440 10*3/uL — ABNORMAL HIGH (ref 150–400)
Platelets: 458 10*3/uL — ABNORMAL HIGH (ref 150–400)
Platelets: 472 10*3/uL — ABNORMAL HIGH (ref 150–400)
RBC: 3.76 MIL/uL — ABNORMAL LOW (ref 3.87–5.11)
RDW: 15.6 % — ABNORMAL HIGH (ref 11.5–15.5)
RDW: 16.9 % — ABNORMAL HIGH (ref 11.5–15.5)
RDW: 17.7 % — ABNORMAL HIGH (ref 11.5–15.5)
WBC: 4.8 10*3/uL (ref 4.0–10.5)
WBC: 5.3 10*3/uL (ref 4.0–10.5)

## 2010-12-09 LAB — COMPREHENSIVE METABOLIC PANEL
ALT: 10 U/L (ref 0–35)
AST: 15 U/L (ref 0–37)
Albumin: 2 g/dL — ABNORMAL LOW (ref 3.5–5.2)
Alkaline Phosphatase: 69 U/L (ref 39–117)
BUN: 3 mg/dL — ABNORMAL LOW (ref 6–23)
CO2: 29 mEq/L (ref 19–32)
Chloride: 105 mEq/L (ref 96–112)
Chloride: 106 mEq/L (ref 96–112)
Creatinine, Ser: 0.53 mg/dL (ref 0.4–1.2)
GFR calc Af Amer: >60 mL/min (ref >60)
GFR calc non Af Amer: >60 mL/min (ref >60)
Sodium: 139 mEq/L (ref 135–145)
Total Bilirubin: 0.2 mg/dL — ABNORMAL LOW (ref 0.3–1.2)
Total Bilirubin: 0.2 mg/dL — ABNORMAL LOW (ref 0.3–1.2)
Total Protein: 5.5 g/dL — ABNORMAL LOW (ref 6.0–8.3)

## 2010-12-09 LAB — PROTIME-INR
INR: 1.5 — ABNORMAL HIGH (ref 0.00–1.49)
INR: 1.75 — ABNORMAL HIGH (ref 0.00–1.49)
INR: 2.3 — ABNORMAL HIGH (ref 0.00–1.49)
INR: 2.45 — ABNORMAL HIGH (ref 0.00–1.49)
INR: 2.7 — ABNORMAL HIGH (ref 0.00–1.49)
Prothrombin Time: 18.3 seconds — ABNORMAL HIGH (ref 11.6–15.2)
Prothrombin Time: 22.5 seconds — ABNORMAL HIGH (ref 11.6–15.2)
Prothrombin Time: 23 seconds — ABNORMAL HIGH (ref 11.6–15.2)
Prothrombin Time: 25.6 seconds — ABNORMAL HIGH (ref 11.6–15.2)
Prothrombin Time: 26.7 seconds — ABNORMAL HIGH (ref 11.6–15.2)
Prothrombin Time: 28.8 seconds — ABNORMAL HIGH (ref 11.6–15.2)
Prothrombin Time: 29.7 seconds — ABNORMAL HIGH (ref 11.6–15.2)

## 2010-12-09 LAB — CROSSMATCH: ABO/RH(D): A POS

## 2010-12-09 LAB — DIFFERENTIAL
Basophils Absolute: 0.1 10*3/uL (ref 0.0–0.1)
Basophils Relative: 1 % (ref 0–1)
Neutro Abs: 4.1 10*3/uL (ref 1.7–7.7)
Neutrophils Relative %: 65 % (ref 43–77)

## 2010-12-09 LAB — BASIC METABOLIC PANEL
BUN: 5 mg/dL — ABNORMAL LOW (ref 6–23)
BUN: 5 mg/dL — ABNORMAL LOW (ref 6–23)
CO2: 26 mEq/L (ref 19–32)
Calcium: 8.3 mg/dL — ABNORMAL LOW (ref 8.4–10.5)
Calcium: 8.6 mg/dL (ref 8.4–10.5)
Creatinine, Ser: 0.6 mg/dL (ref 0.4–1.2)
Creatinine, Ser: 0.68 mg/dL (ref 0.4–1.2)
GFR calc Af Amer: >60 mL/min (ref >60)
GFR calc non Af Amer: >60 mL/min (ref >60)
GFR calc non Af Amer: >60 mL/min (ref >60)
Glucose, Bld: 92 mg/dL (ref 70–99)
Glucose, Bld: 95 mg/dL (ref 70–99)
Potassium: 3.7 mEq/L (ref 3.5–5.1)
Sodium: 136 mEq/L (ref 135–145)
Sodium: 137 mEq/L (ref 135–145)

## 2010-12-10 LAB — COMPREHENSIVE METABOLIC PANEL
ALT: 15 U/L (ref 0–35)
ALT: 21 U/L (ref 0–35)
ALT: 23 U/L (ref 0–35)
ALT: 23 U/L (ref 0–35)
ALT: 24 U/L (ref 0–35)
ALT: 26 U/L (ref 0–35)
ALT: 26 U/L (ref 0–35)
ALT: 36 U/L — ABNORMAL HIGH (ref 0–35)
ALT: 9 U/L (ref 0–35)
ALT: 34 U/L (ref 0–35)
AST: 14 U/L (ref 0–37)
AST: 14 U/L (ref 0–37)
AST: 15 U/L (ref 0–37)
AST: 16 U/L (ref 0–37)
AST: 22 U/L (ref 0–37)
AST: 22 U/L (ref 0–37)
AST: 33 U/L (ref 0–37)
AST: 34 U/L (ref 0–37)
AST: 36 U/L (ref 0–37)
AST: 44 U/L — ABNORMAL HIGH (ref 0–37)
AST: 26 U/L (ref 0–37)
AST: 30 U/L (ref 0–37)
Albumin: 1.7 g/dL — ABNORMAL LOW (ref 3.5–5.2)
Albumin: 1.8 g/dL — ABNORMAL LOW (ref 3.5–5.2)
Albumin: 1.8 g/dL — ABNORMAL LOW (ref 3.5–5.2)
Albumin: 1.8 g/dL — ABNORMAL LOW (ref 3.5–5.2)
Albumin: 1.8 g/dL — ABNORMAL LOW (ref 3.5–5.2)
Albumin: 1.9 g/dL — ABNORMAL LOW (ref 3.5–5.2)
Albumin: 2 g/dL — ABNORMAL LOW (ref 3.5–5.2)
Albumin: 2 g/dL — ABNORMAL LOW (ref 3.5–5.2)
Albumin: 2 g/dL — ABNORMAL LOW (ref 3.5–5.2)
Alkaline Phosphatase: 101 U/L (ref 39–117)
Alkaline Phosphatase: 102 U/L (ref 39–117)
Alkaline Phosphatase: 105 U/L (ref 39–117)
Alkaline Phosphatase: 106 U/L (ref 39–117)
Alkaline Phosphatase: 109 U/L (ref 39–117)
Alkaline Phosphatase: 126 U/L — ABNORMAL HIGH (ref 39–117)
Alkaline Phosphatase: 138 U/L — ABNORMAL HIGH (ref 39–117)
Alkaline Phosphatase: 79 U/L (ref 39–117)
Alkaline Phosphatase: 80 U/L (ref 39–117)
Alkaline Phosphatase: 88 U/L (ref 39–117)
Alkaline Phosphatase: 99 U/L (ref 39–117)
Alkaline Phosphatase: 126 U/L — ABNORMAL HIGH (ref 39–117)
BUN: 10 mg/dL (ref 6–23)
BUN: 3 mg/dL — ABNORMAL LOW (ref 6–23)
BUN: 3 mg/dL — ABNORMAL LOW (ref 6–23)
BUN: 4 mg/dL — ABNORMAL LOW (ref 6–23)
BUN: 4 mg/dL — ABNORMAL LOW (ref 6–23)
BUN: 4 mg/dL — ABNORMAL LOW (ref 6–23)
BUN: 8 mg/dL (ref 6–23)
BUN: 128 mg/dL — ABNORMAL HIGH (ref 6–23)
BUN: 83 mg/dL — ABNORMAL HIGH (ref 6–23)
CO2: 25 mEq/L (ref 19–32)
CO2: 25 mEq/L (ref 19–32)
CO2: 25 mEq/L (ref 19–32)
CO2: 27 mEq/L (ref 19–32)
CO2: 27 mEq/L (ref 19–32)
CO2: 28 mEq/L (ref 19–32)
CO2: 29 mEq/L (ref 19–32)
CO2: 15 mEq/L — ABNORMAL LOW (ref 19–32)
CO2: 27 mEq/L (ref 19–32)
Calcium: 7.9 mg/dL — ABNORMAL LOW (ref 8.4–10.5)
Calcium: 7.9 mg/dL — ABNORMAL LOW (ref 8.4–10.5)
Calcium: 7.9 mg/dL — ABNORMAL LOW (ref 8.4–10.5)
Calcium: 8 mg/dL — ABNORMAL LOW (ref 8.4–10.5)
Calcium: 8.1 mg/dL — ABNORMAL LOW (ref 8.4–10.5)
Calcium: 8.2 mg/dL — ABNORMAL LOW (ref 8.4–10.5)
Calcium: 9.1 mg/dL (ref 8.4–10.5)
Chloride: 103 mEq/L (ref 96–112)
Chloride: 103 mEq/L (ref 96–112)
Chloride: 105 mEq/L (ref 96–112)
Chloride: 106 mEq/L (ref 96–112)
Chloride: 110 mEq/L (ref 96–112)
Chloride: 111 mEq/L (ref 96–112)
Chloride: 113 mEq/L — ABNORMAL HIGH (ref 96–112)
Chloride: 118 mEq/L — ABNORMAL HIGH (ref 96–112)
Chloride: 106 mEq/L (ref 96–112)
Chloride: 115 mEq/L — ABNORMAL HIGH (ref 96–112)
Chloride: 116 mEq/L — ABNORMAL HIGH (ref 96–112)
Creatinine, Ser: 0.56 mg/dL (ref 0.4–1.2)
Creatinine, Ser: 0.58 mg/dL (ref 0.4–1.2)
Creatinine, Ser: 0.59 mg/dL (ref 0.4–1.2)
Creatinine, Ser: 0.63 mg/dL (ref 0.4–1.2)
Creatinine, Ser: 0.69 mg/dL (ref 0.4–1.2)
Creatinine, Ser: 0.77 mg/dL (ref 0.4–1.2)
Creatinine, Ser: 0.91 mg/dL (ref 0.4–1.2)
Creatinine, Ser: 2.75 mg/dL — ABNORMAL HIGH (ref 0.4–1.2)
Creatinine, Ser: 6.02 mg/dL — ABNORMAL HIGH (ref 0.4–1.2)
GFR calc Af Amer: >60 mL/min (ref >60)
GFR calc Af Amer: >60 mL/min (ref >60)
GFR calc Af Amer: >60 mL/min (ref >60)
GFR calc Af Amer: >60 mL/min (ref >60)
GFR calc Af Amer: >60 mL/min (ref >60)
GFR calc Af Amer: >60 mL/min (ref >60)
GFR calc Af Amer: >60 mL/min (ref >60)
GFR calc Af Amer: >60 mL/min (ref >60)
GFR calc Af Amer: >60 mL/min (ref >60)
GFR calc Af Amer: 5 mL/min — ABNORMAL LOW (ref >60)
GFR calc non Af Amer: >60 mL/min (ref >60)
GFR calc non Af Amer: >60 mL/min (ref >60)
GFR calc non Af Amer: >60 mL/min (ref >60)
GFR calc non Af Amer: >60 mL/min (ref >60)
GFR calc non Af Amer: >60 mL/min (ref >60)
GFR calc non Af Amer: >60 mL/min (ref >60)
GFR calc non Af Amer: >60 mL/min (ref >60)
GFR calc non Af Amer: >60 mL/min (ref >60)
GFR calc non Af Amer: >60 mL/min (ref >60)
GFR calc non Af Amer: 18 mL/min — ABNORMAL LOW (ref >60)
GFR calc non Af Amer: 4 mL/min — ABNORMAL LOW (ref >60)
Glucose, Bld: 100 mg/dL — ABNORMAL HIGH (ref 70–99)
Glucose, Bld: 84 mg/dL (ref 70–99)
Glucose, Bld: 95 mg/dL (ref 70–99)
Glucose, Bld: 95 mg/dL (ref 70–99)
Glucose, Bld: 95 mg/dL (ref 70–99)
Glucose, Bld: 96 mg/dL (ref 70–99)
Glucose, Bld: 99 mg/dL (ref 70–99)
Glucose, Bld: 122 mg/dL — ABNORMAL HIGH (ref 70–99)
Potassium: 3 mEq/L — ABNORMAL LOW (ref 3.5–5.1)
Potassium: 3 mEq/L — ABNORMAL LOW (ref 3.5–5.1)
Potassium: 3.3 mEq/L — ABNORMAL LOW (ref 3.5–5.1)
Potassium: 3.4 mEq/L — ABNORMAL LOW (ref 3.5–5.1)
Potassium: 3.5 mEq/L (ref 3.5–5.1)
Potassium: 3.6 mEq/L (ref 3.5–5.1)
Potassium: 3.7 mEq/L (ref 3.5–5.1)
Potassium: 3.8 mEq/L (ref 3.5–5.1)
Potassium: 3.9 mEq/L (ref 3.5–5.1)
Potassium: 4.2 mEq/L (ref 3.5–5.1)
Potassium: 6.5 mEq/L (ref 3.5–5.1)
Sodium: 136 mEq/L (ref 135–145)
Sodium: 138 mEq/L (ref 135–145)
Sodium: 140 mEq/L (ref 135–145)
Sodium: 141 mEq/L (ref 135–145)
Sodium: 147 mEq/L — ABNORMAL HIGH (ref 135–145)
Sodium: 147 mEq/L — ABNORMAL HIGH (ref 135–145)
Sodium: 148 mEq/L — ABNORMAL HIGH (ref 135–145)
Sodium: 145 mEq/L (ref 135–145)
Total Bilirubin: 0.2 mg/dL — ABNORMAL LOW (ref 0.3–1.2)
Total Bilirubin: 0.3 mg/dL (ref 0.3–1.2)
Total Bilirubin: 0.3 mg/dL (ref 0.3–1.2)
Total Bilirubin: 0.3 mg/dL (ref 0.3–1.2)
Total Bilirubin: 0.4 mg/dL (ref 0.3–1.2)
Total Bilirubin: 0.4 mg/dL (ref 0.3–1.2)
Total Bilirubin: 0.4 mg/dL (ref 0.3–1.2)
Total Bilirubin: 0.4 mg/dL (ref 0.3–1.2)
Total Bilirubin: 0.5 mg/dL (ref 0.3–1.2)
Total Bilirubin: 0.6 mg/dL (ref 0.3–1.2)
Total Bilirubin: 0.9 mg/dL (ref 0.3–1.2)
Total Bilirubin: 1.3 mg/dL — ABNORMAL HIGH (ref 0.3–1.2)
Total Protein: 4.5 g/dL — ABNORMAL LOW (ref 6.0–8.3)
Total Protein: 4.7 g/dL — ABNORMAL LOW (ref 6.0–8.3)
Total Protein: 5 g/dL — ABNORMAL LOW (ref 6.0–8.3)
Total Protein: 5 g/dL — ABNORMAL LOW (ref 6.0–8.3)
Total Protein: 5.1 g/dL — ABNORMAL LOW (ref 6.0–8.3)
Total Protein: 5.4 g/dL — ABNORMAL LOW (ref 6.0–8.3)
Total Protein: 5.6 g/dL — ABNORMAL LOW (ref 6.0–8.3)
Total Protein: 5.2 g/dL — ABNORMAL LOW (ref 6.0–8.3)

## 2010-12-10 LAB — RETICULOCYTES
RBC.: 2.68 MIL/uL — ABNORMAL LOW (ref 3.87–5.11)
RBC.: 2.7 MIL/uL — ABNORMAL LOW (ref 3.87–5.11)
Retic Count, Absolute: 40.2 10*3/uL (ref 19.0–186.0)
Retic Ct Pct: 1.5 % (ref 0.4–3.1)
Retic Ct Pct: 1.7 % (ref 0.4–3.1)

## 2010-12-10 LAB — CBC
HCT: 22.9 % — ABNORMAL LOW (ref 36.0–46.0)
HCT: 23.9 % — ABNORMAL LOW (ref 36.0–46.0)
HCT: 23.9 % — ABNORMAL LOW (ref 36.0–46.0)
HCT: 24.9 % — ABNORMAL LOW (ref 36.0–46.0)
HCT: 27.1 % — ABNORMAL LOW (ref 36.0–46.0)
HCT: 27.7 % — ABNORMAL LOW (ref 36.0–46.0)
HCT: 29.2 % — ABNORMAL LOW (ref 36.0–46.0)
HCT: 29.2 % — ABNORMAL LOW (ref 36.0–46.0)
HCT: 37 % (ref 36.0–46.0)
Hemoglobin: 7.6 g/dL — ABNORMAL LOW (ref 12.0–15.0)
Hemoglobin: 7.8 g/dL — ABNORMAL LOW (ref 12.0–15.0)
Hemoglobin: 8.6 g/dL — ABNORMAL LOW (ref 12.0–15.0)
Hemoglobin: 9.1 g/dL — ABNORMAL LOW (ref 12.0–15.0)
Hemoglobin: 12.4 g/dL (ref 12.0–15.0)
MCH: 29.6 pg (ref 26.0–34.0)
MCH: 31.4 pg (ref 26.0–34.0)
MCH: 31.7 pg (ref 26.0–34.0)
MCH: 32.5 pg (ref 26.0–34.0)
MCH: 31.9 pg (ref 26.0–34.0)
MCHC: 30.3 g/dL (ref 30.0–36.0)
MCHC: 31 g/dL (ref 30.0–36.0)
MCHC: 31.3 g/dL (ref 30.0–36.0)
MCHC: 31.5 g/dL (ref 30.0–36.0)
MCHC: 31.8 g/dL (ref 30.0–36.0)
MCV: 101.5 fL — ABNORMAL HIGH (ref 78.0–100.0)
MCV: 95 fL (ref 78.0–100.0)
MCV: 97.8 fL (ref 78.0–100.0)
MCV: 98 fL (ref 78.0–100.0)
Platelets: 118 10*3/uL — ABNORMAL LOW (ref 150–400)
Platelets: 176 10*3/uL (ref 150–400)
Platelets: 346 10*3/uL (ref 150–400)
Platelets: 407 10*3/uL — ABNORMAL HIGH (ref 150–400)
RBC: 2.67 MIL/uL — ABNORMAL LOW (ref 3.87–5.11)
RBC: 2.71 MIL/uL — ABNORMAL LOW (ref 3.87–5.11)
RBC: 2.74 MIL/uL — ABNORMAL LOW (ref 3.87–5.11)
RBC: 2.86 MIL/uL — ABNORMAL LOW (ref 3.87–5.11)
RBC: 3.86 MIL/uL — ABNORMAL LOW (ref 3.87–5.11)
RDW: 14.6 % (ref 11.5–15.5)
RDW: 14.8 % (ref 11.5–15.5)
RDW: 14.8 % (ref 11.5–15.5)
RDW: 14.9 % (ref 11.5–15.5)
RDW: 15.2 % (ref 11.5–15.5)
RDW: 15.3 % (ref 11.5–15.5)
RDW: 15.6 % — ABNORMAL HIGH (ref 11.5–15.5)
RDW: 15.1 % (ref 11.5–15.5)
WBC: 10.9 10*3/uL — ABNORMAL HIGH (ref 4.0–10.5)
WBC: 12 10*3/uL — ABNORMAL HIGH (ref 4.0–10.5)
WBC: 13.4 10*3/uL — ABNORMAL HIGH (ref 4.0–10.5)
WBC: 14.8 10*3/uL — ABNORMAL HIGH (ref 4.0–10.5)
WBC: 8.4 10*3/uL (ref 4.0–10.5)
WBC: 8.4 10*3/uL (ref 4.0–10.5)
WBC: 8.6 10*3/uL (ref 4.0–10.5)
WBC: 10.9 10*3/uL — ABNORMAL HIGH (ref 4.0–10.5)
WBC: 14.2 10*3/uL — ABNORMAL HIGH (ref 4.0–10.5)

## 2010-12-10 LAB — PROTIME-INR
INR: 1.16 (ref 0.00–1.49)
INR: 1.21 (ref 0.00–1.49)
INR: 1.24 (ref 0.00–1.49)
INR: 1.36 (ref 0.00–1.49)
INR: 2.11 — ABNORMAL HIGH (ref 0.00–1.49)
INR: 2.74 — ABNORMAL HIGH (ref 0.00–1.49)
INR: 3.13 — ABNORMAL HIGH (ref 0.00–1.49)
INR: 3.27 — ABNORMAL HIGH (ref 0.00–1.49)
INR: 1.33 (ref 0.00–1.49)
INR: 1.47 (ref 0.00–1.49)
Prothrombin Time: 15.5 seconds — ABNORMAL HIGH (ref 11.6–15.2)
Prothrombin Time: 15.6 seconds — ABNORMAL HIGH (ref 11.6–15.2)
Prothrombin Time: 16.4 seconds — ABNORMAL HIGH (ref 11.6–15.2)
Prothrombin Time: 17 seconds — ABNORMAL HIGH (ref 11.6–15.2)
Prothrombin Time: 32.2 seconds — ABNORMAL HIGH (ref 11.6–15.2)
Prothrombin Time: 33.3 seconds — ABNORMAL HIGH (ref 11.6–15.2)
Prothrombin Time: 16.7 seconds — ABNORMAL HIGH (ref 11.6–15.2)

## 2010-12-10 LAB — BASIC METABOLIC PANEL
BUN: 47 mg/dL — ABNORMAL HIGH (ref 6–23)
Chloride: 124 mEq/L — ABNORMAL HIGH (ref 96–112)
GFR calc Af Amer: 34 mL/min — ABNORMAL LOW (ref >60)
GFR calc Af Amer: 51 mL/min — ABNORMAL LOW (ref >60)
GFR calc non Af Amer: 28 mL/min — ABNORMAL LOW (ref >60)
GFR calc non Af Amer: 42 mL/min — ABNORMAL LOW (ref >60)
Glucose, Bld: 115 mg/dL — ABNORMAL HIGH (ref 70–99)
Potassium: 2.6 mEq/L — CL (ref 3.5–5.1)
Potassium: 4.6 mEq/L (ref 3.5–5.1)
Sodium: 157 mEq/L — ABNORMAL HIGH (ref 135–145)
Sodium: 158 mEq/L — ABNORMAL HIGH (ref 135–145)

## 2010-12-10 LAB — CROSSMATCH: Antibody Screen: NEGATIVE

## 2010-12-10 LAB — GLUCOSE, CAPILLARY
Glucose-Capillary: 121 mg/dL — ABNORMAL HIGH (ref 70–99)
Glucose-Capillary: 126 mg/dL — ABNORMAL HIGH (ref 70–99)
Glucose-Capillary: 173 mg/dL — ABNORMAL HIGH (ref 70–99)
Glucose-Capillary: 58 mg/dL — ABNORMAL LOW (ref 70–99)
Glucose-Capillary: 61 mg/dL — ABNORMAL LOW (ref 70–99)
Glucose-Capillary: 90 mg/dL (ref 70–99)
Glucose-Capillary: 99 mg/dL (ref 70–99)
Glucose-Capillary: 102 mg/dL — ABNORMAL HIGH (ref 70–99)
Glucose-Capillary: 118 mg/dL — ABNORMAL HIGH (ref 70–99)
Glucose-Capillary: 77 mg/dL (ref 70–99)
Glucose-Capillary: 93 mg/dL (ref 70–99)

## 2010-12-10 LAB — CARDIAC PANEL(CRET KIN+CKTOT+MB+TROPI)
CK, MB: 3.3 ng/mL (ref 0.3–4.0)
CK, MB: 3.7 ng/mL (ref 0.3–4.0)
CK, MB: 4 ng/mL (ref 0.3–4.0)
Relative Index: 2.3 (ref 0.0–2.5)
Relative Index: 3 — ABNORMAL HIGH (ref 0.0–2.5)
Total CK: 110 U/L (ref 7–177)
Troponin I: 0.03 ng/mL (ref 0.00–0.06)
Troponin I: 0.03 ng/mL (ref 0.00–0.06)

## 2010-12-10 LAB — URINE CULTURE
Colony Count: 9000
Culture  Setup Time: 201109231531

## 2010-12-10 LAB — POCT I-STAT 3, ART BLOOD GAS (G3+)
Bicarbonate: 20.2 mEq/L (ref 20.0–24.0)
TCO2: 21 mmol/L (ref 0–100)
pH, Arterial: 7.318 — ABNORMAL LOW (ref 7.350–7.400)
pO2, Arterial: 76 mmHg — ABNORMAL LOW (ref 80.0–100.0)

## 2010-12-10 LAB — CULTURE, RESPIRATORY W GRAM STAIN

## 2010-12-10 LAB — BRAIN NATRIURETIC PEPTIDE: Pro B Natriuretic peptide (BNP): 34 pg/mL (ref 0.0–100.0)

## 2010-12-10 LAB — POCT I-STAT 3, VENOUS BLOOD GAS (G3P V)
Acid-base deficit: 7 mmol/L — ABNORMAL HIGH (ref 0.0–2.0)
Bicarbonate: 19.5 mEq/L — ABNORMAL LOW (ref 20.0–24.0)
O2 Saturation: 81 %
TCO2: 21 mmol/L (ref 0–100)
pCO2, Ven: 42.3 mmHg — ABNORMAL LOW (ref 45.0–50.0)
pO2, Ven: 51 mmHg — ABNORMAL HIGH (ref 30.0–45.0)

## 2010-12-10 LAB — BLOOD GAS, ARTERIAL
Drawn by: 31843
O2 Content: 6 L/min
O2 Saturation: 96.5 %
Patient temperature: 98.6
pH, Arterial: 7.418 — ABNORMAL HIGH (ref 7.350–7.400)

## 2010-12-10 LAB — URINALYSIS, MICROSCOPIC ONLY
Bilirubin Urine: NEGATIVE
Glucose, UA: NEGATIVE mg/dL
Hgb urine dipstick: NEGATIVE
Specific Gravity, Urine: 1.011 (ref 1.005–1.030)
pH: 6.5 (ref 5.0–8.0)

## 2010-12-10 LAB — FERRITIN: Ferritin: 331 ng/mL — ABNORMAL HIGH (ref 10–291)

## 2010-12-10 LAB — LACTIC ACID, PLASMA: Lactic Acid, Venous: 2.3 mmol/L — ABNORMAL HIGH (ref 0.5–2.2)

## 2010-12-10 LAB — PHOSPHORUS: Phosphorus: 2.7 mg/dL (ref 2.3–4.6)

## 2010-12-10 LAB — HEMOGLOBIN AND HEMATOCRIT, BLOOD
HCT: 27.1 % — ABNORMAL LOW (ref 36.0–46.0)
Hemoglobin: 8.4 g/dL — ABNORMAL LOW (ref 12.0–15.0)

## 2010-12-10 LAB — URINALYSIS, ROUTINE W REFLEX MICROSCOPIC
Hgb urine dipstick: NEGATIVE
Nitrite: NEGATIVE
Specific Gravity, Urine: 1.021 (ref 1.005–1.030)
Urobilinogen, UA: 0.2 mg/dL (ref 0.0–1.0)

## 2010-12-10 LAB — FOLATE
Folate: 2 ng/mL — ABNORMAL LOW
Folate: 3 ng/mL — ABNORMAL LOW

## 2010-12-10 LAB — APTT
aPTT: 28 seconds (ref 24–37)
aPTT: 26 seconds (ref 24–37)

## 2010-12-10 LAB — DIFFERENTIAL
Basophils Absolute: 0.1 10*3/uL (ref 0.0–0.1)
Lymphs Abs: 1.3 10*3/uL (ref 0.7–4.0)
Monocytes Relative: 8 % (ref 3–12)
Neutro Abs: 11.7 10*3/uL — ABNORMAL HIGH (ref 1.7–7.7)

## 2010-12-10 LAB — VITAMIN B12
Vitamin B-12: 1052 pg/mL — ABNORMAL HIGH (ref 211–911)
Vitamin B-12: 1287 pg/mL — ABNORMAL HIGH (ref 211–911)

## 2010-12-10 LAB — MRSA PCR SCREENING: MRSA by PCR: NEGATIVE

## 2010-12-10 LAB — STREP PNEUMONIAE URINARY ANTIGEN: Strep Pneumo Urinary Antigen: NEGATIVE

## 2010-12-10 LAB — OSMOLALITY: Osmolality: 352 mOsm/kg — ABNORMAL HIGH (ref 275–300)

## 2010-12-10 LAB — CULTURE, BLOOD (ROUTINE X 2)
Culture  Setup Time: 201109140215
Culture: NO GROWTH

## 2010-12-10 LAB — PROCALCITONIN: Procalcitonin: 4.02 ng/mL

## 2010-12-10 LAB — TYPE AND SCREEN

## 2010-12-10 LAB — RAPID URINE DRUG SCREEN, HOSP PERFORMED
Barbiturates: NOT DETECTED
Opiates: NOT DETECTED

## 2010-12-10 LAB — ETHANOL: Alcohol, Ethyl (B): <5 mg/dL (ref 0–10)

## 2010-12-10 LAB — ACETAMINOPHEN LEVEL: Acetaminophen (Tylenol), Serum: <10 ug/mL — ABNORMAL LOW (ref 10–30)

## 2010-12-10 LAB — IRON AND TIBC
Saturation Ratios: 14 % — ABNORMAL LOW (ref 20–55)
TIBC: 155 ug/dL — ABNORMAL LOW (ref 250–470)

## 2010-12-10 LAB — VANCOMYCIN, RANDOM: Vancomycin Rm: 12.5 ug/mL

## 2010-12-10 LAB — MAGNESIUM: Magnesium: 1.9 mg/dL (ref 1.5–2.5)

## 2010-12-10 LAB — ABO/RH: ABO/RH(D): A POS

## 2011-02-12 NOTE — H&P (Signed)
NAMEJACEY, ECKERSON                ACCOUNT NO.:  [id number]   MEDICAL RECORD NO.:  [id number]          PATIENT TYPE:  IPS   LOCATION:  0508                          FACILITY:  [facility]   PHYSICIAN:  Jeanice Lim, M.D. DATE OF BIRTH:  [birth year]   DATE OF ADMISSION:  07/01/2005  DATE OF DISCHARGE:                         PSYCHIATRIC ADMISSION ASSESSMENT   IDENTIFYING INFORMATION:  This is an involuntary admission to the services  of Dr. Aleatha Borer.  This is a 55 year old widowed white female.  The  commitment papers indicate that she had taken a large number of pills in the  last 48 hours.  She was so sedated that she fell, breaking her left humerus  up near her shoulder.  The patient's son found several empty bottles in her  room.  She remains depressed since the death of her husband in 01-14-03.  The patient is determined to be a danger to self and, hence, was  involuntarily committed.  In the emergency room, her alcohol level was less  than 5.  Her urine drug screen was positive for barbiturates but this would  be expected.  The ER record indicates that she had taken 81 Fioricet and 21  5 mg Zyprexa pills.  She was felt to be at a risk for seizures.  Apparently,  back in July 09, 2004, she had a seizure after again taking too many  medicines including Wellbutrin.  She fell, crushing her vertebrae.  She  underwent surgery in November.   PAST PSYCHIATRIC HISTORY:  She was here in Jan 13, 2005 from April 23 to  April 26.  She had overdosed on Vicodin at that time.  The record indicates  that she was hospitalized on other time previously again for an overdose in  1993 at [facility].  Today, she denies that.  Does not understand why  that is in the record.  She is currently in outpatient therapy with hospice  and she sees Dr. Milford Cage on an outpatient basis.   MEDICATIONS:  She is currently prescribed Cymbalta 90 mg p.o. q.d.  She says  that she has Xanax 2 mg  t.i.d., Lunesta at hour of sleep and trazodone at  hour of sleep as well as Fosamax, which was started after she had the  vetebral fractures repaired in November of 2005.  Sometimes she remembers to  take it and sometimes she does not.   ALLERGIES:  She has no known drug allergies.   POSITIVE PHYSICAL FINDINGS:  She has bumps and bruises consistent with her  fall.  Her left arm is in a sling.  She has a fracture of her left humerus  and is supposed to be seeing an orthopedist.  Toward that end, she would  prefer to see Dr. Farris Has who fixed her vertebral fractures.   MENTAL STATUS EXAM:  At the moment, she is alert and oriented x3.  Her  appearance and behavior is somewhat unkempt, bruises.  Her speech is not  pressured.  Her mood is somewhat depressed.  Her affect is congruent.  Her  thought processes  are clear, rational and goal-oriented.  I need to see my  children, they're coming.  Her judgment and insight are fair.  Her  concentration and memory are intact.  Her intelligence is at least average.  She denies suicidal or homicidal ideation.  She denies auditory or visual  hallucinations.  She states that, back in April, she did have hallucinations  but not at this time.   DIAGNOSES:  AXIS I:  Major depressive disorder, recurrent, severe.  AXIS II:  Deferred.  AXIS III:  Fracture of left humerus and recent vetebral fractures in October  of 2005.  She is not known to be osteopenic but she was being treated for  such.  AXIS IV:  Grief.  She denies any other issues.  AXIS V:  35.   PLAN:  Adjust her medication.  Toward that end, Dr. Kathrynn Running had already  adjusted her medications.  She was started on Paxil CR 12.5 mg q.d.,  Lamictal 25 mg q.d., Xanax 1 mg t.i.d., 2 mg q.h.s., Restoril 30 mg at 9  p.m., Seroquel 75 mg at 9 p.m. and Xanax 0.5 mg q.6h. p.r.n. anxiety and the  casemanager was to arrange for a family meeting.  We did put through a  request for Dr. Farris Has at [facility] to  review her x-ray and to give  recommendations regarding further care for her fractured left humerus.      Mickie Leonarda Salon, P.A.-C.      Jeanice Lim, M.D.  Electronically Signed    MD/MEDQ  D:  07/01/2005  T:  07/02/2005  Job:  [id number]

## 2011-02-12 NOTE — Discharge Summary (Signed)
[place]  Patient:    Christina [name], Christina [name] Visit Number: [id number] MRN: [id number]          Service Type: SUR Location: 5000 5025 01 Attending Physician:  Colbert Ewing Dictated by:   Oris Drone Petrarca, P.A.-C. Admit Date:  07/01/2001 Discharge Date: 07/05/2001                             Discharge Summary  ADMISSION DIAGNOSIS:  Abscess, left foot.  DISCHARGE DIAGNOSIS:  Abscess, left foot.  PROCEDURE:  Irrigation and debridement of left foot.  HISTORY OF PRESENT ILLNESS:  The patient is a 55 year old white female with left foot pain and swelling x 1 week.  She had previously bone surgery in the left foot in the intermetatarsal area by a podiatrist.  Had a recent injection, and has developed pain and swelling.  Was brought to the emergency room, and it was felt at that time that she had an abscess in the foot, and was admitted at this time for I&D.  HOSPITAL COURSE:  The patient is a 55 year old white female who was admitted on 07/01/01, after appropriate laboratory studies were obtained and a consultation with infectious disease, and an aspiration of the area, she was sent to the operating room where she underwent a formal irrigation and debridement of the area.  She tolerated the procedure well.  She was placed on Zosyn 3.375 g q.8h. IV.  Ice to the left foot was also then placed.  Zosyn on July 03, 2001, was changed to 4.5 g IV q.8h. by Dr. Orvan Falconer who was consulted for her infection.  A Penrose drain was advanced over her hospital course, and discontinued.  She was then noted to have marked improvement.  Her IV antibiotics were discontinued.  She was discharged on 07/05/01, to return back to the office on Friday for a re-evaluation.  LABORATORY DATA:  EKG showed normal sinus rhythm and normal EKG.  Hemoglobin 11.4, hematocrit 32.8%, white count 5600, platelets 260,000.  Chemistries of 07/02/01, showed a sodium of 139, potassium  3.6, chloride 106, CO2 25, glucose 109, BUN 8, creatinine 0.8, calcium 8.2, total protein 5.4, albumin 2.8, AST 41, ALT 47, ALP 67, total bilirubin 0.3.  C-reactive protein was 1.7. Cultures of the foot showed no growth.  No anaerobes were noted.  Yeast and fungus were not completed at the time of this dictation.  DISCHARGE MEDICATIONS: 1. Percocet one or two tabs q.4h. p.r.n. pain. 2. Augmentin one tablet b.i.d.  ACTIVITY:  She will be up wearing her wooden postoperative shoe.  DIET:  No restrictions.  WOUND CARE:  Keep wound clean and dry.  FOLLOWUP:  In the office on Friday for re-evaluation.  CONDITION ON DISCHARGE:  Improved. Dictated by:   Oris Drone Petrarca, P.A.-C. Attending Physician:  Colbert Ewing DD:  07/27/01 TD:  07/28/01 Job: [id number]

## 2011-02-12 NOTE — Op Note (Signed)
[place]  Patient:    KERSTEN, SALMONS Visit Number: [id number] MRN: [id number]          Service Type: SUR Location: 5000 5025 01 Attending Physician:  Colbert Ewing Dictated by:   Loreta Ave, M.D. Proc. Date: 07/02/01 Admit Date:  07/01/2001                             Operative Report  PREOPERATIVE DIAGNOSIS:  Deep abscess, left foot between third and fourth metatarsal heads.  POSTOPERATIVE DIAGNOSIS:  Deep abscess, left foot between third and fourth metatarsal heads with associated hematoma.  OPERATION PERFORMED:  Incision and drainage with pulse lavage, abscess, left foot.  SURGEON:  Loreta Ave, M.D.  ASSISTANT:  Julien Girt, P.A.  ANESTHESIA:  General.  ESTIMATED BLOOD LOSS:  Minimal.  TOURNIQUET TIME:  20 minutes with an Esmarch.  SPECIMENS:  None.  CULTURES:  Aerobic, anaerobic culture sent, Penrose x 2.  COMPLICATIONS:  None.  DRESSING:  Soft compressive.  DESCRIPTION OF PROCEDURE:  The patient was brought to the operating room and placed on the operating table in supine position.  After adequate anesthesia had been obtained, the left foot was prepped and draped in the usual sterile fashion.  The area of obvious abscess which was both dorsal and plantar to the interspace between third and fourth metatarsal heads was accessed through a previous surgical incision.  Directly under the skin was the wall of an abscess which had cloudy serous fluid as well as a hematoma.  This was thoroughly probed, evacuated.  It extended between the metatarsal heads all the way down to the plantar aspect of the foot.  Surrounding necrotic debris was cleared out throughout the entire area.  All recesses explored to be sure that there were no satellite lesions.  It was then thoroughly lavaged with 9L of saline and a liter of antibiotic solution.  A Penrose drain was placed deep through the dorsal incision.  The  central portion of the incision was left open with one nylon placed at either end to slightly reapproximate for speeding of healing later.  During the procedure, an Esmarch was utilized as a tourniquet to allow visualization.  Removed at the end and hemostasis obtained before completion.  Sterile compressive dressing applied.  Anesthesia reversed.  Brought to recovery room.  Tolerated surgery well.  No complications. Dictated by:   Loreta Ave, M.D. Attending Physician:  Colbert Ewing DD:  07/02/01 TD:  07/03/01 Job: [id number]

## 2011-02-12 NOTE — Op Note (Signed)
NAMEOMIE, FERGER                ACCOUNT NO.:  [id number]   MEDICAL RECORD NO.:  [id number]          PATIENT TYPE:  INP   LOCATION:  2899                         FACILITY:  [facility]   PHYSICIAN:  Doralee Albino. Carola Frost, M.D. DATE OF BIRTH:  [birth year]   DATE OF PROCEDURE:  07/09/2005  DATE OF DISCHARGE:                                 OPERATIVE REPORT   PREOPERATIVE DIAGNOSIS:  Left humeral shaft fracture.   POSTOPERATIVE DIAGNOSIS:  Left humeral shaft fracture.   PROCEDURE:  Open reduction and internal fixation, left humeral shaft  fracture.   SURGEON:  Doralee Albino. Carola Frost, MD   ASSISTANT:  Cecil Cranker, PA   ANESTHESIA:  General.   COMPLICATIONS:  None.   DRAINS:  None.   SPECIMENS:  None.   ESTIMATED BLOOD LOSS:  250 mL.   DISPOSITION:  To PACU.   CONDITION:  Stable.   BRIEF SUMMARY OF INDICATION FOR PROCEDURE:  Brodie Scovell is a right-hand-  dominant 55 year old female with a past medical history notable for  questionable seizures.  She underwent prior workup by the Neurology which  included MRI and EEGs among other tests and was found to be negative.  She  awoke in the middle of the night on the floor several days ago with left arm  pain and deformity.  She was seen, evaluated, and noted to have a severely  angulated proximal humeral shaft fracture right at the distal aspect of the  deltoid insertion.  We discussed with her the possibilities for treatment  and she wished to proceed with recommended internal fixation of this injury,  given its location with respect to the deltoid and the amount of deformity  present.  She was seen and evaluated by the Neurology Practice once more to  see if this likely represented seizure activity and if there would be any  specific perioperative interventions to minimize the risk of recurrent  seizure activity.  Again, the neurologist did not think that any  intervention was necessary and they felt she was safe to proceed with  the  surgical fixation of the fracture.  We did discuss with Ms. Scogin the  possibility of nonunion, delayed union, infection, nerve injury, vessel  injury, the possibility of future fracture adjacent to the end of the plate  as well as thromboembolism and possible motion restriction  of the elbow or  shoulder.  After a full discussion of these risks and benefits, she wished  to proceed.   BRIEF DESCRIPTION OF PROCEDURE:  Ms. Lukin was taken to the operating room  after administration of preoperative antibiotics.  Her left upper extremity  was prepped and draped in usual sterile fashion after induction of general  anesthesia.  The procedure was performed supine on a radiolucent table.  A  standard anterolateral incision was made and dissection carried carefully  through the subcutaneous tissues where the cephalic vein, which did appear  rather large, was encountered and retracted medially.  We then fell into the  fracture site, which was markedly displaced and angulated, and as  anticipated, was a long spike of bone  which was attached to the deltoid.  A  knife was used to tease back the periosteal edges while protecting the  integrity of the deltoid attachment.  A significant amount of clot was  removed and then reduction maneuver performed and stabilized provisionally  with a K-wire; this enabled [place] to select a 10-hole 3.5 LCD CP plate, which  was placed along the tension side of the bone directly over the proximal  fragment.  A 3.5 drill was used overdrill the near-cortex and then the plate  was lagged down, visualizing compression at the fracture site.  Provisional  fixation having been secured, we then obtained AP and lateral fluoro images  which showed appropriate reduction.  We then placed a locked screw  proximally and then a standard cortical screw just distal to the lateral  spike to achieve compression and buttressing of this fragment.  We were then  able to further tighten  our lag screw, resulting in a solid compression and  apposition of the plate to the bone.  Multiple locked screws were then  placed distally and proximally.  Final AP and lateral images showed  appropriate reduction.  We were able to achieve at least 10 cortices  distally and 7 locked proximally in addition to the lag screw.  The final  fluoro-images showed appropriate reduction and hardware placement.  The deep  tissues were reapproximated with 0 Vicryl, the subcu with 2-0 and the skin  with a running 3-0 Prolene and Steri-Strips.  A sterile gently compressive  dressing was applied and then a sling for comfort.   PROGNOSIS:  Ms. Grosshans's prognosis with this fracture is quite good.  We  were able to achieve a solid fixation and apply the plate to the tension  side of the fracture which should provide a solid biomechanical environment  for healing of this injury.  She will be allowed early active and passive  range of motion of the elbow as well as passive shoulder motion.  When she  returns to clinic in 10-14 days, we will remove her sutures and obtain new x-  rays.  We will contact the Neurology Service for more detailed information  regarding possible interventions to reduce risk of seizure activity, if  indeed this is what triggered her injury.      Doralee Albino. Carola Frost, M.D.  Electronically Signed     MHH/MEDQ  D:  07/09/2005  T:  07/09/2005  Job:  [id number]

## 2011-02-12 NOTE — Discharge Summary (Signed)
Christina [name], [name]                ACCOUNT NO.:  [id number]   MEDICAL RECORD NO.:  [id number]          PATIENT TYPE:  IPS   LOCATION:  0506                          [facility]   PHYSICIAN:  Geoffery Lyons, M.D.      DATE OF BIRTH:  [birth year]   DATE OF ADMISSION:  01/17/2005  DATE OF DISCHARGE:  01/20/2005                                 DISCHARGE SUMMARY   CHIEF COMPLAINT AND PRESENT ILLNESS:  This was the first admission to [facility] for this 55 year old white female, widowed,  involuntarily committed.  Was taken to the emergency room by the family for  taking an overdose of Vicodin, acting bizarre, not eating for two days.  Disorganized and agitated in the emergency room, swinging at staff, tried to  rip the board off the wall.  Did not respond to Geodon in the emergency  room.   PAST PSYCHIATRIC HISTORY:  No current treatment.  First time at [facility].  Hospitalized in 1993 at [facility].   ALCOHOL/DRUG HISTORY:  Denies the use or abuse of any substances.   MEDICAL HISTORY:  Migraine headaches, seizure disorder, last seizure October  of 2005.   MEDICATIONS:  Valium 5 mg four times a day (none in 1-2 weeks), Zoloft for  depression.   PHYSICAL EXAMINATION:  Performed and failed to show any acute findings.   LABORATORY DATA:  CT scan negative.  Drug screening positive for  benzodiazepines.  Blood chemistry within normal limits.   MENTAL STATUS EXAM:  Alert female.  Guarded.  Looking alarmed.  Some motor  restlessness.  Labile.  Speech no pressure.  Mood anxious.  Affect anxious,  restless, fluctuating.  Thought processes not as spontaneous, __________.  No clear delusions.  No suicidal or homicidal ideation.  No active  hallucinations.  Cognition oriented as to person, one day off on time.   ADMISSION DIAGNOSES:   AXIS I:  1.  Rule out benzodiazepine withdrawal.  2.  Depressive disorder not otherwise specified.   AXIS II:  No  diagnosis.   AXIS III:  Hypokalemia.   AXIS IV:  Moderate.   AXIS V:  Global Assessment of Functioning upon admission 25-30; highest  Global Assessment of Functioning in the last year 75.   HOSPITAL COURSE:  She was admitted and she was started in individual and  group psychotherapy.  She was given Ambien.  She was given Ativan. She  required some Geodon.  She was eventually placed on Valium 5 mg four times a  day.  She was placed on Cymbalta 20 mg per day and given some Ultram for  pain.  The history validated that the son took her to the emergency room  after she was hallucinating, hearing voices telling her to harm herself,  seeing the police.  She was very agitated upon arrival.  Tried to swing at  the tech when taking her blood.  Husband died two years prior to this  admission.  She has seen hospice counselor but there is evidence of  decreased sleep and lack of intake of  food for more than two days.  Claimed  she took Vicodin, __________ for sleep.  One prior suicide attempt by  overdose in 1993.  Placed in [facility].  She settled down.  She was  increasingly more depressed as the anniversary of the husband's death came  closer.  Not eating or sleeping.  Three seizures since October of 2005.  She  was able to elaborate some more on the death of the husband who died in his  sleep at age 65 from a heart attack.  Married for 25 years.  Endorsed guilt  as she felt that she could have awaken in the middle of the night and do  something.  Also worried that he might have tried to get her up but she was  not able to get up.  There was a family session with her sons and daughter.  She did admit that she took an overdose on Vicodin trying to sleep.  She was  able to talk about the death of the husband.  She was tearful through most  of the session.  On April 25th, she was in full contact with reality.  There  were no suicidal ideation, no homicidal ideation, no hallucinations, no   delusions.  She said she was ready to go home.  She was willing to pursue  counseling and she was willing to pursue medication.  She understood the  need to address the loss, the grief and she was willing to pursue further  work it.  Upon discharge, in full contact with reality.  No suicidal or  homicidal ideation.  Willing and motivated to pursue further outpatient  treatment.   DISCHARGE DIAGNOSES:   AXIS I:  1.  Major depression.  2.  Benzodiazepine delirium, resolved.   AXIS II:  No diagnosis.   AXIS III:  Hypokalemia.   AXIS IV:  Moderate.   AXIS V:  Global Assessment of Functioning upon discharge 55-60.   DISCHARGE MEDICATIONS:  1.  Valium 5 mg four times a day.  2.  Cymbalta 30 mg daily.  3.  Ambien 10 mg at night for sleep.   FOLLOW UP:  [facility].     IL/MEDQ  D:  02/14/2005  T:  02/15/2005  Job:  [id number]

## 2011-02-12 NOTE — Discharge Summary (Signed)
NAMEJAEDAH, Christina [name]                ACCOUNT NO.:  [id number]   MEDICAL RECORD NO.:  [id number]          PATIENT TYPE:  IPS   LOCATION:  0508                          [facility]ACILITY[facility]  [facility]   PHYSICIAN:  Jeanice Lim, M.D. DATE OF BIRTH:  [birth year]   DATE OF ADMISSION:  07/01/2005  DATE OF DISCHARGE:  07/03/2005                                 DISCHARGE SUMMARY   IDENTIFYING DATA:  This is a 55 year old widowed Caucasian female taking a  large number of pills in the last 48 hours by her commitment paper report.  She was so sedated she fell breaking her left humerus up near her shoulder.  Patient's son found several empty bottles in her room.  She remains  depressed since the death of her husband in 15-Jan-2003.  Alcohol level was  less than 5.  Urine drug screen positive for barbiturates.  She had taken 81  Fioricet and 21 Zyprexa 5-mg.  She was felt to be at risk for seizures back  on July 09, 2005.  She had a seizure after taking too many medicines  including Wellbutrin, and she fell crushing a vertebra.  She underwent  surgery in November.   PAST PSYCHIATRIC HISTORY:  Here in April 23 to January 20, 2005.  She had  overdosed on Vicodin at that time.  Previous she had been hospitalized in  1993 at [facility].  Today she denies that and did not understand why  that was in the record, so there is some question about the accuracy of  that.  She reports outpatient treatment with hospice and sees Dr. Milford Cage.   CURRENT MEDICATIONS:  1.  Cymbalta 90 mg a day.  2.  Xanax 2 mg t.i.d.  3.  Lunesta for sleep.  4.  Trazodone for sleep.  5.  Fosamax and had vertebral fractures repaired.  Reports sometimes      remembers to take medications, sometimes does not.   ALLERGIES:  No known drug allergies.   PHYSICAL EXAMINATION:  Neurologic exam essentially within normal limits.  Bruises consistent with fall.  Left arm in sling.  Fracture of left humerus.  Has been set up to see an  orthopedist.   MENTAL STATUS EXAM:  Alert and oriented x 3.  Speech not pressured.  Mood  depressed.  Affect congruent.  Thought processes goal directed.  I need to  see my children; they are coming.  Judgment and insight are fair.  Coordination intact.  She denied suicidal or homicidal ideation, regretting  overdose.  Reports that in the past she has had hallucinations but none  recently.   ADMISSION DIAGNOSES:  AXIS I:  Major depressive disorder, recurrent severe  with psychotic features.  AXIS II:  Deferred.  AXIS III:  Fracture of left humerus and recent vertebral fractures in  October 2005.  Not known to be osteopenic but was being treated for this.  AXIS IV:  Grief, complicated bereavement.  Limited support system.  AXIS V:  35/55.   HOSPITAL COURSE:  Patient was admitted, ordered routine p.r.n. medications,  underwent further monitoring,  was encouraged to participate in individual,  group and milieu therapy.  She was optimized and resumed on medications,  Paxil, Lamictal, Xanax, Restoril.  She was monitored for sleep and safety.  Encouraged to develop an aftercare plan and develop healthier coping skills  as well as attempts to mobilize her support system remained.  Patient was  monitored medically closely and reported positive response to medications  and was agreeable for intensive outpatient treatment, participated in  aftercare planning.  She was discharged in improved condition.  Mood was  more euthymic.  Affect brighter.  Thought processes goal directed.  Improved  judgment and insight.  Future oriented.  She was given medication education.  Risk/benefit ratio, alternative treatment regarding medications were  discussed.   DISCHARGE MEDICATIONS:  1.  Paxil CR 12.5 daily.  2.  Lamictal 25 mg daily.  3.  Xanax 1 mg t.i.d., Xanax 2 mg q.h.s.  4.  Restoril 30 mg 9:00 p.m.  5.  Seroquel 50 mg 1-1/2 at 9:00 p.m.   FOLLOW UP:  Discharged to follow up with the [facility]  intensive outpatient clinic.   DISCHARGE DIAGNOSES:  AXIS I:  Major depressive disorder, recurrent severe  with psychotic features.  AXIS II:  Deferred.  AXIS III:  Fracture of left humerus and recent vertebral fractures in  October 2005.  Not known to be osteopenic but was being treated for this.  AXIS IV:  Grief, complicated bereavement.  Limited support system.  AXIS V:  Global assessment of function on discharge 55.  Condition was  partially improved.      Jeanice Lim, M.D.  Electronically Signed     JEM/MEDQ  D:  07/31/2005  T:  08/01/2005  Job:  [id number]

## 2011-02-12 NOTE — Discharge Summary (Signed)
Christina [name], Christina [name]                ACCOUNT NO.:  [id number]   MEDICAL RECORD NO.:  [id number]          PATIENT TYPE:  INP   LOCATION:  5010                         FACILITY:  [facility]   PHYSICIAN:  Doralee Albino. Carola Frost, M.D. DATE OF BIRTH:  Jul 19, 1956   DATE OF ADMISSION:  07/09/2005  DATE OF DISCHARGE:  07/11/2005                                 DISCHARGE SUMMARY   ADMISSION DIAGNOSIS:  Left humerus fracture.   DISCHARGE DIAGNOSES:  1.  Left humerus fracture.  2.  Postoperative blood loss anemia.   PROCEDURE PERFORMED:  July 09, 2005, ORIF of left humerus fracture  without complications.   CONSULTATIONS:  None.   HISTORY OF PRESENT ILLNESS:  This is a 55 year old white female who is right-  hand dominant who awoke in the middle of the night finding herself on the  floor and having severe left arm pain and deformity.  She was seen and  evaluated and noted to have a displaced proximal humerus fracture.  She then  was instructed on the pros and cons of the surgery and elected to proceed  with open reduction and internal fixation.   PHYSICAL EXAMINATION ON ADMISSION:  GENERAL APPEARANCE:  This is a well-  appearing, well-developed 55 year old white female in no acute distress.  HEENT:  Pupils are equal, round, reactive to light and accommodation.  Extraocular movements intact.  Atraumatic and normocephalic.  NECK:  Supple.  LUNGS:  Clear to auscultation bilaterally.  CARDIOVASCULAR:  Regular rate and rhythm with normal S1 and S2.  ABDOMEN:  Positive bowel sounds, nontender and soft.  EXTREMITIES:  Distal neurovascular exam is intact at this time.  There is  obvious deformity in the left upper extremity.  SKIN:  Warm and dry and intact over fracture site.   ASSESSMENT:  Left humerus fracture.   PLAN:  Open reduction and internal fixation.   HOSPITAL COURSE:  On July 09, 2005, patient underwent open reduction and  internal fixation of left humerus without complications.   Patient seen  postoperatively and had intact radian, ulnar and median sensory and motor.  She will be admitted for pain control and laboratory data in the a.m. and  anticipate discharge on postoperative day #1 or 2 depending on pain control  and blood work.   On July 10, 2005, patient is seen and noted that hemoglobin and  hematocrit is down to 7.24from original 10.5, transfused two units of packed  red blood cells.   On July 11, 2005, postoperative day #2, patient is seen and evaluated.  She remains afebrile.  Hemoglobin is up to 11.1.  No change in exam from  previous days.  Wound dressing is changed.  Incision is well approximated  without erythema.  Patient will be discharged today.   CONDITION ON DISCHARGE:  Stable.   Patient is discharged to home.  Patient is given Percocet and Robaxin for  pain control.  Patient is to follow up with Dr. Doralee Albino. Handy in  approximately seven to 10 days, please call [phone] for appointment.      Cecil Cranker, PA  Doralee Albino. Carola Frost, M.D.  Electronically Signed    RWC/MEDQ  D:  08/24/2005  T:  08/25/2005  Job:  [id number]

## 2012-04-10 ENCOUNTER — Encounter (HOSPITAL_COMMUNITY): Payer: Self-pay | Admitting: Emergency Medicine

## 2012-04-10 ENCOUNTER — Emergency Department (HOSPITAL_COMMUNITY): Payer: Self-pay

## 2012-04-10 ENCOUNTER — Emergency Department (HOSPITAL_COMMUNITY)
Admission: EM | Admit: 2012-04-10 | Discharge: 2012-04-10 | Disposition: A | Discharge status: Home / Self Care | Payer: Self-pay | Attending: Emergency Medicine | Admitting: Emergency Medicine

## 2012-04-10 DIAGNOSIS — Y93K1 Activity, walking an animal: Secondary | ICD-10-CM | POA: Insufficient documentation

## 2012-04-10 DIAGNOSIS — F341 Dysthymic disorder: Secondary | ICD-10-CM | POA: Insufficient documentation

## 2012-04-10 DIAGNOSIS — W19XXXA Unspecified fall, initial encounter: Secondary | ICD-10-CM | POA: Insufficient documentation

## 2012-04-10 DIAGNOSIS — S42293A Other displaced fracture of upper end of unspecified humerus, initial encounter for closed fracture: Secondary | ICD-10-CM | POA: Insufficient documentation

## 2012-04-10 DIAGNOSIS — E7800 Pure hypercholesterolemia, unspecified: Secondary | ICD-10-CM | POA: Insufficient documentation

## 2012-04-10 HISTORY — DX: Depression, unspecified: F32.A

## 2012-04-10 HISTORY — DX: Deep phlebothrombosis in pregnancy, unspecified trimester: O22.30

## 2012-04-10 HISTORY — DX: Hypotension, unspecified: I95.9

## 2012-04-10 HISTORY — DX: Insomnia, unspecified: G47.00

## 2012-04-10 HISTORY — DX: Anxiety disorder, unspecified: F41.9

## 2012-04-10 HISTORY — DX: Unspecified abdominal hernia without obstruction or gangrene: K46.9

## 2012-04-10 HISTORY — DX: Major depressive disorder, single episode, unspecified: F32.9

## 2012-04-10 HISTORY — DX: Pure hypercholesterolemia, unspecified: E78.00

## 2012-04-10 HISTORY — DX: Reserved for concepts with insufficient information to code with codable children: IMO0002

## 2012-04-10 MED ORDER — OXYCODONE-ACETAMINOPHEN 5-325 MG PO TABS
2.0000 | ORAL_TABLET | Freq: Once | ORAL | Status: AC
  Administered 2012-04-10: 2 via ORAL
  Filled 2012-04-10: qty 2

## 2012-04-10 MED ORDER — OXYCODONE-ACETAMINOPHEN 5-325 MG PO TABS: 1.0000 | ORAL_TABLET | Freq: Four times a day (QID) | ORAL | Status: AC | PRN

## 2012-04-10 NOTE — ED Notes (Signed)
Patient transported to X-ray 

## 2012-04-10 NOTE — ED Notes (Signed)
Patient returned from X-ray 

## 2012-04-10 NOTE — ED Notes (Signed)
Patient discharge to home with a steady gait. Respirations equal and unlabored. Skin warm and dry. No acute distress noted.

## 2012-04-10 NOTE — ED Notes (Signed)
Pt states she fell yesterday while walking the dog she was sitting  Pt states the dog went to run and when the leash got taunt it jerked her causing her to fall  Pt states the dog drug her about 10 feet  Pt states she has pain to her left upper arm  Pt states she already has a plate in that arm  Pt states the pain radiates up into her shoulder area

## 2012-04-15 NOTE — ED Provider Notes (Signed)
History     CSN: [id number]  Arrival date & time 04/10/12  1950   First MD Initiated Contact with Patient 04/10/12 2056      Chief Complaint  Patient presents with  . Fall    (Consider location/radiation/quality/duration/timing/severity/associated sxs/prior treatment) HPI Comments: Patient reports that just prior to arrival she fell while walking a dog.  She reports that she had the dog on a leash.  The dog then started to run and caused her to fall on the left arm.  She is now having pain of her left upper arm.  She reports that she has had previous surgery on that arm with plates and screws.  She is concerned that she may have broken it.  She has not taken anything for pain.  She denies numbness or tingling.    The history is provided by the patient.    Past Medical History  Diagnosis Date  . Hernia   . Ulcer   . Depression   . Anxiety   . Insomnia   . High cholesterol   . Hypotension   . DVT (deep vein thrombosis) in pregnancy     Past Surgical History  Procedure Date  . Arm surgery   . Abdominal hysterectomy   . Tubal ligation   . Knee surgery   . Back surgery   . Foot surgery   . Greenfield filter placement     Family History  Problem Relation Age of Onset  . Hyperlipidemia Other   . Stroke Other   . Heart attack Other   . Cancer Other   . Hypertension Other     History  Substance Use Topics  . Smoking status: Never Smoker   . Smokeless tobacco: Not on file  . Alcohol Use: No    OB History    Grav Para Term Preterm Abortions TAB SAB Ect Mult Living                  Review of Systems  Gastrointestinal: Negative for nausea and vomiting.  Musculoskeletal: Negative for joint swelling and gait problem.       Left upper arm pain  Skin: Negative for wound.  Neurological: Negative for syncope and numbness.    Allergies  Review of patient's allergies indicates no known allergies.  Home Medications   Current Outpatient Rx  Name Route Sig  Dispense Refill  . ACETAMINOPHEN 325 MG PO TABS Oral Take 650 mg by mouth every 6 (six) hours as needed. Pain.    Marland Kitchen CITALOPRAM HYDROBROMIDE 40 MG PO TABS Oral Take 40 mg by mouth daily.    Marland Kitchen CLONAZEPAM 1 MG PO TABS Oral Take 0.5-1 mg by mouth 3 (three) times daily. Take 1 tablet by mouth twice daily and 1 tablet at bedtime.    Marland Kitchen TRIAZOLAM 0.25 MG PO TABS Oral Take 0.5 mg by mouth at bedtime.    . OXYCODONE-ACETAMINOPHEN 5-325 MG PO TABS Oral Take 1-2 tablets by mouth every 6 (six) hours as needed for pain. 20 tablet 0    BP 100/60  Pulse 100  Temp 98.2 F (36.8 C) (Oral)  Resp 18  SpO2 98%  Physical Exam  Nursing note and vitals reviewed. Constitutional: She appears well-developed and well-nourished. No distress.  HENT:  Head: Normocephalic and atraumatic.  Neck: Normal range of motion. Neck supple.  Cardiovascular: Normal rate, regular rhythm and normal heart sounds.   Pulses:      Radial pulses are 2+ on the right side, and  2+ on the left side.  Pulmonary/Chest: Effort normal and breath sounds normal.  Musculoskeletal:       Left shoulder: She exhibits decreased range of motion. She exhibits no tenderness, no bony tenderness, no swelling and no deformity.       Arms: Neurological: She is alert. No sensory deficit. Gait normal.  Skin: Skin is warm, dry and intact. No bruising and no ecchymosis noted. She is not diaphoretic. No erythema.  Psychiatric: She has a normal mood and affect.    ED Course  Procedures (including critical care time)  Labs Reviewed - No data to display No results found.   1. Fracture of humeral head, closed       MDM  Patient with closed fracture of the humeral head.  Neurovascularly intact.  Patient put in a sling and given follow up with Orthopedics.         Pascal Lux Virgil, PA-C 04/15/12 0010

## 2012-04-15 NOTE — ED Provider Notes (Signed)
Medical screening examination/treatment/procedure(s) were performed by non-physician practitioner and as supervising physician I was immediately available for consultation/collaboration.  Flint Melter, MD 04/15/12 [phone]

## 2012-06-07 ENCOUNTER — Emergency Department (HOSPITAL_COMMUNITY): Payer: Medicaid Other

## 2012-06-07 ENCOUNTER — Encounter (HOSPITAL_COMMUNITY): Payer: Self-pay | Admitting: Emergency Medicine

## 2012-06-07 ENCOUNTER — Emergency Department (HOSPITAL_COMMUNITY)
Admission: EM | Admit: 2012-06-07 | Discharge: 2012-06-07 | Disposition: A | Discharge status: Home / Self Care | Payer: Medicaid Other | Attending: Emergency Medicine | Admitting: Emergency Medicine

## 2012-06-07 DIAGNOSIS — F3289 Other specified depressive episodes: Secondary | ICD-10-CM | POA: Insufficient documentation

## 2012-06-07 DIAGNOSIS — E7800 Pure hypercholesterolemia, unspecified: Secondary | ICD-10-CM | POA: Insufficient documentation

## 2012-06-07 DIAGNOSIS — Z86718 Personal history of other venous thrombosis and embolism: Secondary | ICD-10-CM | POA: Insufficient documentation

## 2012-06-07 DIAGNOSIS — F411 Generalized anxiety disorder: Secondary | ICD-10-CM | POA: Insufficient documentation

## 2012-06-07 DIAGNOSIS — S42309A Unspecified fracture of shaft of humerus, unspecified arm, initial encounter for closed fracture: Secondary | ICD-10-CM

## 2012-06-07 DIAGNOSIS — IMO0002 Reserved for concepts with insufficient information to code with codable children: Secondary | ICD-10-CM | POA: Insufficient documentation

## 2012-06-07 DIAGNOSIS — F329 Major depressive disorder, single episode, unspecified: Secondary | ICD-10-CM | POA: Insufficient documentation

## 2012-06-07 HISTORY — DX: Unspecified fracture of shaft of humerus, unspecified arm, initial encounter for closed fracture: S42.309A

## 2012-06-07 MED ORDER — HYDROCODONE-ACETAMINOPHEN 5-500 MG PO TABS: 1.0000 | ORAL_TABLET | Freq: Four times a day (QID) | ORAL | Status: AC | PRN

## 2012-06-07 MED ORDER — OXYCODONE-ACETAMINOPHEN 5-325 MG PO TABS
1.0000 | ORAL_TABLET | Freq: Once | ORAL | Status: AC
  Administered 2012-06-07: 1 via ORAL
  Filled 2012-06-07: qty 1

## 2012-06-07 NOTE — ED Provider Notes (Signed)
Medical screening examination/treatment/procedure(s) were performed by non-physician practitioner and as supervising physician I was immediately available for consultation/collaboration.   Prisha Hiley, MD 06/07/12 1542 

## 2012-06-07 NOTE — ED Provider Notes (Signed)
History     CSN: [id number]  Arrival date & time 06/07/12  1122   First MD Initiated Contact with Patient 06/07/12 1239      Chief Complaint  Patient presents with  . Arm Pain    hx fx humerus    (Consider location/radiation/quality/duration/timing/severity/associated sxs/prior treatment) HPI Comments: Christina [name] is a 56 y.o. Female who presents with complaint of left shoulder and upper arm pain. States she fell two months ago. Was seen here in ER. Was told she fractured her humerus. She was given a sling and follow up with orthopedist. States she has also injured same arm prior to this and had to have a surgical repair with internal hardware. Pt states she has been wearing sling for the last two months but the pain is not improving. She is unable to lift her arm up, and having pain with flexion and extension of the elbow. She has not seen an orthopedist because they would not accept her, her medicaid has not kicked in yet.   Past Medical History  Diagnosis Date  . Hernia   . Ulcer   . Depression   . Anxiety   . Insomnia   . High cholesterol   . Hypotension   . DVT (deep vein thrombosis) in pregnancy   . Humerus fracture     Past Surgical History  Procedure Date  . Arm surgery   . Abdominal hysterectomy   . Tubal ligation   . Knee surgery   . Back surgery   . Foot surgery   . Greenfield filter placement     has had blood clots in leg    Family History  Problem Relation Age of Onset  . Hyperlipidemia Other   . Stroke Other   . Heart attack Other   . Cancer Other   . Hypertension Other     History  Substance Use Topics  . Smoking status: Never Smoker   . Smokeless tobacco: Not on file  . Alcohol Use: No    OB History    Grav Para Term Preterm Abortions TAB SAB Ect Mult Living                  Review of Systems  Constitutional: Negative for fever and chills.  Respiratory: Negative.   Cardiovascular: Negative.   Musculoskeletal: Positive for  arthralgias.  Skin: Negative.   Neurological: Negative for weakness and numbness.    Allergies  Review of patient's allergies indicates no known allergies.  Home Medications   Current Outpatient Rx  Name Route Sig Dispense Refill  . ACETAMINOPHEN 325 MG PO TABS Oral Take 650 mg by mouth every 6 (six) hours as needed. Pain.    Marland Kitchen CITALOPRAM HYDROBROMIDE 40 MG PO TABS Oral Take 40 mg by mouth daily.    Marland Kitchen CLONAZEPAM 1 MG PO TABS Oral Take 0.5-1 mg by mouth 3 (three) times daily. Take 1 tablet by mouth twice daily and 1 tablet at bedtime.    Marland Kitchen TRIAZOLAM 0.25 MG PO TABS Oral Take 0.5 mg by mouth at bedtime.      BP 109/81  Pulse 100  Temp 98.7 F (37.1 C) (Oral)  Resp 16  SpO2 100%  Physical Exam  Nursing note and vitals reviewed. Constitutional: She appears well-developed and well-nourished. No distress.  Cardiovascular: Normal rate, regular rhythm and normal heart sounds.   Pulmonary/Chest: Effort normal and breath sounds normal. No respiratory distress. She has no wheezes. She has no rales.  Musculoskeletal:  Normal appearing left shoulder, left elbow, upper arm, other than some atrophy to the muscles. Tender to palpation over entire left shoulder joint and down both anterior and posterior humerus down to the elbow. No pain over the elbow joint. Pain with active or passive any ROM of left shoulder. No pain with passive flexion and extension of the left elbow. Pain with active flexion and extension of the elbow. Good strength of bicep and tricep against resistance.   Neurological: She is alert.  Skin: Skin is warm and dry.    ED Course  Procedures (including critical care time)  PT with fracture to right humerus two months ago. Continues to have pain. Will reimage.   Dg Shoulder Left  06/07/2012  *RADIOLOGY REPORT*  Clinical Data: Left shoulder pain.  LEFT SHOULDER - 2+ VIEW  Comparison: 04/10/2012.  Findings: A lateral plate and screw fixation device in the proximal left  humeral diaphysis is again noted, without obvious periprosthetic fracture or other complicating features. Nondisplaced fracture of the greater tuberosity of the proximal humerus demonstrates some increasing sclerosis, suggesting normal bony healing.  No other acute abnormalities appreciated.  The humeral head is properly located.  Post vertebroplasty changes are seen throughout the mid thoracic spine.  IMPRESSION: 1.  Healing nondisplaced greater tuberosity fracture of the left proximal humerus.   Original Report Authenticated By: Florencia Reasons, M.D.    Dg Humerus Left  06/07/2012  *RADIOLOGY REPORT*  Clinical Data: Fractured left humeral greater tuberosity 8 weeks ago, increasing pain  LEFT HUMERUS - 2+ VIEW  Comparison: Left humeral films of 04/10/2012  Findings: Plate and screw fixation of the mid proximal left humeral shaft is stable.  The bones are osteopenic. The fracture of the left humeral greater tuberosity again is noted with incomplete healing.  IMPRESSION: Incomplete healing of left humeral greater tuberosity fracture. Osteopenia.   Original Report Authenticated By: Juline Patch, M.D.    Pt has incomplete healing of left humeral greater tuberosity fracture. I spoke with Eulah Pont and Wyline Mood nurse, pt was scheduled for an appointment tomorrow at 2:30. Will d/c home   1. Fracture closed, humerus       MDM          Lottie Mussel, PA 06/07/12 1540

## 2012-06-07 NOTE — ED Notes (Signed)
Here July 15th for fx humerus-- has worn sling since, states pain is not much better. Has not followed up with orthopedic dr because states medicaid has not started. Also states has a plate in left humerus from a prior surgery.

## 2012-12-09 ENCOUNTER — Ambulatory Visit: Payer: Medicare Other

## 2012-12-09 ENCOUNTER — Ambulatory Visit (INDEPENDENT_AMBULATORY_CARE_PROVIDER_SITE_OTHER): Payer: Medicare Other | Admitting: Emergency Medicine

## 2012-12-09 VITALS — BP 122/80 | HR 86 | Temp 98.5°F | Resp 16 | Ht 61.0 in | Wt 118.0 lb

## 2012-12-09 DIAGNOSIS — M2550 Pain in unspecified joint: Secondary | ICD-10-CM

## 2012-12-09 DIAGNOSIS — M25572 Pain in left ankle and joints of left foot: Secondary | ICD-10-CM

## 2012-12-09 MED ORDER — HYDROCODONE-ACETAMINOPHEN 5-325 MG PO TABS: 1.0000 | ORAL_TABLET | Freq: Four times a day (QID) | ORAL | Status: DC | PRN

## 2012-12-09 NOTE — Patient Instructions (Addendum)
Foot Sprain The muscles and cord like structures which attach muscle to bone (tendons) that surround the feet are made up of units. A foot sprain can occur at the weakest spot in any of these units. This condition is most often caused by injury to or overuse of the foot, as from playing contact sports, or aggravating a previous injury, or from poor conditioning, or obesity. SYMPTOMS  Pain with movement of the foot.  Tenderness and swelling at the injury site.  Loss of strength is present in moderate or severe sprains. THE THREE GRADES OR SEVERITY OF FOOT SPRAIN ARE:  Mild (Grade I): Slightly pulled muscle without tearing of muscle or tendon fibers or loss of strength.  Moderate (Grade II): Tearing of fibers in a muscle, tendon, or at the attachment to bone, with small decrease in strength.  Severe (Grade III): Rupture of the muscle-tendon-bone attachment, with separation of fibers. Severe sprain requires surgical repair. Often repeating (chronic) sprains are caused by overuse. Sudden (acute) sprains are caused by direct injury or over-use. DIAGNOSIS  Diagnosis of this condition is usually by your own observation. If problems continue, a caregiver may be required for further evaluation and treatment. X-rays may be required to make sure there are not breaks in the bones (fractures) present. Continued problems may require physical therapy for treatment. PREVENTION  Use strength and conditioning exercises appropriate for your sport.  Warm up properly prior to working out.  Use athletic shoes that are made for the sport you are participating in.  Allow adequate time for healing. Early return to activities makes repeat injury more likely, and can lead to an unstable arthritic foot that can result in prolonged disability. Mild sprains generally heal in 3 to 10 days, with moderate and severe sprains taking 2 to 10 weeks. Your caregiver can help you determine the proper time required for  healing. HOME CARE INSTRUCTIONS   Apply ice to the injury for 15 to 20 minutes, 3 to 4 times per day. Put the ice in a plastic bag and place a towel between the bag of ice and your skin.  An elastic wrap (like an Ace bandage) may be used to keep swelling down.  Keep foot above the level of the heart, or at least raised on a footstool, when swelling and pain are present.  Try to avoid use other than gentle range of motion while the foot is painful. Do not resume use until instructed by your caregiver. Then begin use gradually, not increasing use to the point of pain. If pain does develop, decrease use and continue the above measures, gradually increasing activities that do not cause discomfort, until you gradually achieve normal use.  Use crutches if and as instructed, and for the length of time instructed.  Keep injured foot and ankle wrapped between treatments.  Massage foot and ankle for comfort and to keep swelling down. Massage from the toes up towards the knee.  Only take over-the-counter or prescription medicines for pain, discomfort, or fever as directed by your caregiver. SEEK IMMEDIATE MEDICAL CARE IF:   Your pain and swelling increase, or pain is not controlled with medications.  You have loss of feeling in your foot or your foot turns cold or blue.  You develop new, unexplained symptoms, or an increase of the symptoms that brought you to your caregiver. MAKE SURE YOU:   Understand these instructions.  Will watch your condition.  Will get help right away if you are not doing well or   get worse. Document Released: 03/05/2002 Document Revised: 12/06/2011 Document Reviewed: 05/02/2008 [employer] Patient Information 2013 [employer].  

## 2012-12-09 NOTE — Progress Notes (Signed)
  Subjective:    Patient ID: Christina [name], female    DOB: 05-04-1956, 57 y.o.   MRN: [id number]  HPI patient was walking to the basketball game at the [facility] last night and suffered an inversion injury to her left foot. She is here with pain swelling and discomfort in the left foot    Review of Systems     Objective:   Physical Exam There is only minimal amount of swelling over the metatarsals. There is pain with inversion of the foot. There is full range of motion of the ankle without tenderness or swelling. Neurovascular is intact the  [facility] reading (PRIMARY) by  Dr Cleta Alberts no fracture seen       Assessment & Plan:  Since patient states he had to reposition his foot following the injury he could have had a subluxation. Placement along CAM boot crutches pain medication recheck in 7-10 day.

## 2012-12-09 NOTE — Progress Notes (Signed)
Short Camwalker fit and trained for Lt ankle. Eileen Stanford

## 2012-12-14 ENCOUNTER — Other Ambulatory Visit: Payer: Self-pay | Admitting: Emergency Medicine

## 2012-12-14 NOTE — Telephone Encounter (Signed)
Please advise on Hydrocodone refill

## 2012-12-21 ENCOUNTER — Other Ambulatory Visit: Payer: Self-pay | Admitting: Emergency Medicine

## 2012-12-22 ENCOUNTER — Telehealth: Payer: Self-pay

## 2012-12-22 NOTE — Telephone Encounter (Signed)
I did go ahead and refill her medications. If patient is having that much discomfort in her foot and ankle we do need to followup with her next week

## 2012-12-22 NOTE — Telephone Encounter (Signed)
Pharm requests RF of hydrocodone 5-325. Dr Cleta Alberts, do you want to RF this?

## 2012-12-22 NOTE — Telephone Encounter (Signed)
faxed

## 2012-12-22 NOTE — Telephone Encounter (Signed)
PT STATES SHE NEED TO HAVE HER HYDROCODONE CALLED IN. PLEASE CALL PT AT [phone] EVEN THOUGH SHE STATED NOT TO CALL HER BUT CALL THE PHARMACY    [facility] ON [address] IN [place]

## 2012-12-23 NOTE — Telephone Encounter (Signed)
She can have one refill on her medication but then she needs to come in for a recheck or see the orthopedist since she continues to have pain

## 2013-08-09 ENCOUNTER — Emergency Department (HOSPITAL_COMMUNITY): Payer: Medicare Other

## 2013-08-09 ENCOUNTER — Emergency Department (HOSPITAL_COMMUNITY)
Admission: EM | Admit: 2013-08-09 | Discharge: 2013-08-09 | Disposition: A | Discharge status: Home / Self Care | Payer: Medicare Other | Attending: Emergency Medicine | Admitting: Emergency Medicine

## 2013-08-09 ENCOUNTER — Encounter (HOSPITAL_COMMUNITY): Payer: Self-pay | Admitting: Emergency Medicine

## 2013-08-09 DIAGNOSIS — G4700 Insomnia, unspecified: Secondary | ICD-10-CM | POA: Insufficient documentation

## 2013-08-09 DIAGNOSIS — S52599A Other fractures of lower end of unspecified radius, initial encounter for closed fracture: Secondary | ICD-10-CM | POA: Insufficient documentation

## 2013-08-09 DIAGNOSIS — Z9889 Other specified postprocedural states: Secondary | ICD-10-CM | POA: Insufficient documentation

## 2013-08-09 DIAGNOSIS — S5292XA Unspecified fracture of left forearm, initial encounter for closed fracture: Secondary | ICD-10-CM

## 2013-08-09 DIAGNOSIS — Z86718 Personal history of other venous thrombosis and embolism: Secondary | ICD-10-CM | POA: Insufficient documentation

## 2013-08-09 DIAGNOSIS — Z8639 Personal history of other endocrine, nutritional and metabolic disease: Secondary | ICD-10-CM | POA: Insufficient documentation

## 2013-08-09 DIAGNOSIS — F3289 Other specified depressive episodes: Secondary | ICD-10-CM | POA: Insufficient documentation

## 2013-08-09 DIAGNOSIS — W010XXA Fall on same level from slipping, tripping and stumbling without subsequent striking against object, initial encounter: Secondary | ICD-10-CM | POA: Insufficient documentation

## 2013-08-09 DIAGNOSIS — Y9301 Activity, walking, marching and hiking: Secondary | ICD-10-CM | POA: Insufficient documentation

## 2013-08-09 DIAGNOSIS — Z862 Personal history of diseases of the blood and blood-forming organs and certain disorders involving the immune mechanism: Secondary | ICD-10-CM | POA: Insufficient documentation

## 2013-08-09 DIAGNOSIS — Y9289 Other specified places as the place of occurrence of the external cause: Secondary | ICD-10-CM | POA: Insufficient documentation

## 2013-08-09 DIAGNOSIS — K219 Gastro-esophageal reflux disease without esophagitis: Secondary | ICD-10-CM | POA: Insufficient documentation

## 2013-08-09 DIAGNOSIS — Z8679 Personal history of other diseases of the circulatory system: Secondary | ICD-10-CM | POA: Insufficient documentation

## 2013-08-09 DIAGNOSIS — F411 Generalized anxiety disorder: Secondary | ICD-10-CM | POA: Insufficient documentation

## 2013-08-09 DIAGNOSIS — F329 Major depressive disorder, single episode, unspecified: Secondary | ICD-10-CM | POA: Insufficient documentation

## 2013-08-09 DIAGNOSIS — Z79899 Other long term (current) drug therapy: Secondary | ICD-10-CM | POA: Insufficient documentation

## 2013-08-09 DIAGNOSIS — Z872 Personal history of diseases of the skin and subcutaneous tissue: Secondary | ICD-10-CM | POA: Insufficient documentation

## 2013-08-09 DIAGNOSIS — Z9181 History of falling: Secondary | ICD-10-CM | POA: Insufficient documentation

## 2013-08-09 HISTORY — DX: Repeated falls: R29.6

## 2013-08-09 MED ORDER — METHOCARBAMOL 500 MG PO TABS: 1000.0000 mg | ORAL_TABLET | Freq: Four times a day (QID) | ORAL | Status: DC | PRN

## 2013-08-09 MED ORDER — OXYCODONE-ACETAMINOPHEN 5-325 MG PO TABS: ORAL_TABLET | ORAL | Status: DC

## 2013-08-09 MED ORDER — FENTANYL CITRATE 0.05 MG/ML IJ SOLN
50.0000 ug | INTRAMUSCULAR | Status: AC | PRN
  Administered 2013-08-09 (×2): 50 ug via INTRAVENOUS
  Filled 2013-08-09 (×2): qty 2

## 2013-08-09 NOTE — ED Notes (Signed)
Reports tripping and falling today, has obv deformity to left wrist. +radial pulse and pt able to move digits.

## 2013-08-09 NOTE — Progress Notes (Signed)
Orthopedic Tech Progress Note Patient Details:  Christina [name] 10-Dec-1955 [id number]  Ortho Devices Type of Ortho Device: Ace wrap;Arm sling;Sugartong splint Ortho Device/Splint Location: lue Ortho Device/Splint Interventions: Application   Ivanka Kirshner 08/09/2013, 9:01 PM

## 2013-08-09 NOTE — Consult Note (Signed)
Reason for Consult: Left distal radius fracture comminuted complex  Referring Physician: Mcmanus Md  Christina [name] Christina [name] is an 57 y.o. female.  HPI: Patient presents status post same level fall with an abrasion on her right knee and a comminuted complex and articular distal radius fracture left upper extremity. I have reviewed his length she denies neck back chest or nominal pain. She is alert and oriented. She is a history of frequent falls and osteoporosis. She has a history of left humerus fracture. The patient denies neck back chest or nominal pain she denies lower extremity pain complaints other than a mild abrasion to her right knee. We have examined her at length she is alert and oriented.  She does not work she is disabled due to mental stress after her husband died. She notes no prior history of wrist injury.  Past Medical History  Diagnosis Date  . Hernia   . Ulcer   . Depression   . Anxiety   . Insomnia   . High cholesterol   . Hypotension   . DVT (deep vein thrombosis) in pregnancy   . Humerus fracture     Past Surgical History  Procedure Laterality Date  . Arm surgery    . Abdominal hysterectomy    . Tubal ligation    . Knee surgery    . Back surgery    . Foot surgery    . Greenfield filter placement      has had blood clots in leg    Family History  Problem Relation Age of Onset  . Hyperlipidemia Other   . Stroke Other   . Heart attack Other   . Cancer Other   . Hypertension Other     Social History:  reports that she has never smoked. She does not have any smokeless tobacco history on file. She reports that she does not drink alcohol or use illicit drugs.  Allergies: No Known Allergies  Medications: I have reviewed the patient's current medications.  No results found for this or any previous visit (from the past 48 hour(s)).  Dg Wrist Complete Left  08/09/2013   CLINICAL DATA:  Status post fall, history of previous left wrist fracture.  EXAM: LEFT WRIST -  COMPLETE 3+ VIEW  COMPARISON:  None.  FINDINGS: The patient has sustained a comminuted impacted angulated fracture of the distal left radial metaphysis. There is deformity of the distal ulna which may be old. The carpal bones appear intact as do the metacarpals. There is mild diffuse osteopenia. There is a large amount of overlying soft tissue swelling.  IMPRESSION: The patient has sustained an acute comminuted angulated fracture of the distal left radial metaphysis. No intra-articular component is demonstrated. Old deformity of the distal ulna is present without definite evidence of an acute ulnar fracture.   Electronically Signed   By: David  [place]   On: 08/09/2013 17:36    Review of Systems  Constitutional: Negative.   HENT: Negative.   Eyes: Negative.   Cardiovascular: Negative.   Gastrointestinal: Negative.   Genitourinary: Negative.   Skin:       Abrasion right knee superficial  Neurological: Negative.   Endo/Heme/Allergies: Negative.    Blood pressure 115/85, pulse 79, temperature 97.9 F (36.6 C), temperature source Oral, resp. rate 20, SpO2 95.00%. Physical Exam patient has an abrasion to her right knee otherwise lower stem examination is benign.  Her right wrist is without abnormality.  She has a left wrist with obvious deformity she has  intact refill sensation is intact although it occasionally feels unusual and she cannot bend her fingers without pain. Exam Shows well-healed incision left upper arm. Elbow and shoulder are nontender about the left upper extremity. she notes no neck back or  abdominal pain. I've reviewed examination at length. There's no signs of obvious compartment syndrome at present time.   Marland Kitchen.The patient is alert and oriented in no acute distress the patient complains of pain in the affected upper extremity.  The patient is noted to have a normal HEENT exam.  Lung fields show equal chest expansion and no shortness of breath  abdomen exam is nontender without  distention.  Lower extremity examination does not show any fracture dislocation or blood clot symptoms.  Pelvis is stable neck and back are stable and nontender Assessment/Plan: ..08/09/2013  6:51 PM  PATIENT:  Christina [name]    PRE-OPERATIVE DIAGNOSIS:   fracture left wrist comminuted complex  POST-OPERATIVE DIAGNOSIS:  Same  PROCEDURE:   closure reduction left wrist fracture comminuted complex greater than 5 parts with hematoma block   SURGEON:  Karen Chafe, MD  PHYSICIAN ASSISTANT: Wynona Neat  ANESTHESIA:    hematoma block   PREOPERATIVE INDICATIONS:  Ellis Koffler Beneke is a  57 y.o. female with a diagnosis of   The risks benefits and alternatives were discussed with the patient preoperatively including but not limited to the risks of infection, bleeding, nerve injury, cardiopulmonary complications, the need for revision surgery, among others, and the patient was willing to proceed.   OPERATIVE PROCEDURE:  The risks benefits and alternatives were discussed with the patient preoperatively including but not limited to the risks of infection, bleeding, nerve injury, cardiopulmonary complications, the need for revision surgery, among others, and the patient was willing to proceed.   OPERATIVE PROCEDURE: The patient was seen and counseled in regard to the upper extremity predicament. Following this the extremity underwent a hematoma block with lidocaine without epinephrine. I sterilely prepped and draped a skin prior to doing so. Once this was accomplished the patient was placed in fingertrap traction and underwent a reduction of the distal radius. The fracture was reduced and following this a sugar tong splint/cast was applied without difficulty. The neurovascular status showed no evidence of compartment syndrome, dystrophy or infection. the patient had pink fingertips, excellent refill and no complications.  We have discussed with patient elevation,  range of motion to the fingers,  massage and other measures to prevent neurovascular problems.  Once again we plan to proceed with ice elevation move and massage fingers. Postreduction x-ray showed improved position of the fracture. Will plan for serial radiographs and fixation based on the amount of comminution and progressive angulatory collapse as well as wrist Apgar scores. Patient understands these don'ts etc.  ..Keep bandage clean and dry.  Call for any problems.  No smoking.  Criteria for driving a car: you should be off your pain medicine for 7-8 hours, able to drive one handed(confident), thinking clearly and feeling able in your judgement to drive. Continue elevation as it will decrease swelling.  If instructed by MD move your fingers within the confines of the bandage/splint.  Use ice if instructed by your MD. Call immediately for any sudden loss of feeling in your hand/arm or change in functional abilities of the extremity.  Marland Kitchen.We recommend that you to take vitamin C 1000 mg a day to promote healing we also recommend that if you require her pain medicine that he take a stool softener to  prevent constipation as most pain medicines will have constipation side effects. We recommend either Peri-Colace or Senokot and recommend that you also consider adding MiraLAX to prevent the constipation affects from pain medicine if you are required to use them. These medicines are over the counter and maybe purchased at a local pharmacy.   We will plan for surgical stabilization once STS is stable and the OR is ready in the next 3-7 days  Patient understands all issues and plans           Karen Chafe 08/09/2013, 6:48 PM

## 2013-08-09 NOTE — ED Provider Notes (Signed)
CSN: [id number]     Arrival date & time 08/09/13  1640 History   First MD Initiated Contact with Patient 08/09/13 1651     Chief Complaint  Patient presents with  . Wrist Pain    HPI Pt was seen at 1745. Per pt, c/o sudden onset and resolution of one episode of fall while walking her dog PTA. Pt states she has hx of recurrent and frequent falling, which has been worked up by her PMD. States today's episode of falling was per her usual. States when she fell she injured her left wrist. Pt c/o left wrist pain only. Pt recalls events. Denies focal motor weakness, no tingling/numbness in extremities, no CP/palpitations, no SOB/cough, no abd pain, no N/V/D, no back or neck pain.    Past Medical History  Diagnosis Date  . Hernia   . Ulcer   . Depression   . Anxiety   . Insomnia   . High cholesterol   . Hypotension   . DVT (deep vein thrombosis) in pregnancy   . Humerus fracture   . Frequent falls    Past Surgical History  Procedure Laterality Date  . Arm surgery    . Abdominal hysterectomy    . Tubal ligation    . Knee surgery    . Back surgery    . Foot surgery    . Greenfield filter placement      has had blood clots in leg   Family History  Problem Relation Age of Onset  . Hyperlipidemia Other   . Stroke Other   . Heart attack Other   . Cancer Other   . Hypertension Other    History  Substance Use Topics  . Smoking status: Never Smoker   . Smokeless tobacco: Not on file  . Alcohol Use: No    Review of Systems ROS: Statement: All systems negative except as marked or noted in the HPI; Constitutional: Negative for fever and chills. ; ; Eyes: Negative for eye pain, redness and discharge. ; ; ENMT: Negative for ear pain, hoarseness, nasal congestion, sinus pressure and sore throat. ; ; Cardiovascular: Negative for chest pain, palpitations, diaphoresis, dyspnea and peripheral edema. ; ; Respiratory: Negative for cough, wheezing and stridor. ; ; Gastrointestinal: Negative for  nausea, vomiting, diarrhea, abdominal pain, blood in stool, hematemesis, jaundice and rectal bleeding. ; ; Genitourinary: Negative for dysuria, flank pain and hematuria. ; ; Musculoskeletal: Negative for back pain and neck pain. +left wrist pain, swelling and trauma.; ; Skin: Negative for pruritus, rash, abrasions, blisters, bruising and skin lesion.; ; Neuro: Negative for headache, lightheadedness and neck stiffness. Negative for weakness, altered level of consciousness , altered mental status, extremity weakness, paresthesias, involuntary movement, seizure and syncope.      Allergies  Review of patient's allergies indicates no known allergies.  Home Medications   Current Outpatient Rx  Name  Route  Sig  Dispense  Refill  . acetaminophen (TYLENOL) 325 MG tablet   Oral   Take 650 mg by mouth every 6 (six) hours as needed. Pain.         . Aspirin-Acetaminophen (GOODYS BODY PAIN PO)   Oral   Take 1 packet by mouth daily as needed (for pain).         . calcium-vitamin D (OSCAL WITH D) 500-200 MG-UNIT per tablet   Oral   Take 1 tablet by mouth daily with breakfast.         . citalopram (CELEXA) 40 MG tablet  Oral   Take 40 mg by mouth daily.         . clonazePAM (KLONOPIN) 1 MG tablet   Oral   Take 0.5-1.5 mg by mouth 3 (three) times daily. Take 1.5 tablet by mouth twice daily and 0.5 tablet at bedtime.         Marland Kitchen esomeprazole (NEXIUM) 20 MG capsule   Oral   Take 20 mg by mouth 2 (two) times daily before a meal.         . ferrous sulfate 325 (65 FE) MG tablet   Oral   Take 325 mg by mouth daily with breakfast.         . triazolam (HALCION) 0.25 MG tablet   Oral   Take 0.5 mg by mouth at bedtime.          BP 96/58  Pulse 61  Temp(Src) 97.9 F (36.6 C) (Oral)  Resp 20  SpO2 98% Physical Exam 1750: Physical examination:  Nursing notes reviewed; Vital signs and O2 SAT reviewed;  Constitutional: Well developed, Well nourished, Well hydrated, In no acute  distress; Head:  Normocephalic, atraumatic; Eyes: EOMI, PERRL, No scleral icterus; ENMT: Mouth and pharynx normal, Mucous membranes moist; Neck: Supple, Full range of motion, No lymphadenopathy; Cardiovascular: Regular rate and rhythm, No gallop; Respiratory: Breath sounds clear & equal bilaterally, No wheezes.  Speaking full sentences with ease, Normal respiratory effort/excursion; Chest: Nontender, Movement normal; Abdomen: Soft, Nontender, Nondistended, Normal bowel sounds; Genitourinary: No CVA tenderness; Extremities: Pulses normal, +left wrist deformity, localized mild edema and tenderness to palp. No open wounds. NT left hand/elbow/shoulder..; Neuro: AA&Ox3, Major CN grossly intact.  Speech clear. No gross focal motor or sensory deficits in extremities.; Skin: Color normal, Warm, Dry.   ED Course  Procedures    EKG Interpretation   None       MDM  MDM Reviewed: previous chart, nursing note and vitals Interpretation: x-ray     Dg Wrist Complete Left 08/09/2013   CLINICAL DATA:  Post reduction films for patient with a radius fracture.  EXAM: LEFT WRIST - COMPLETE 3+ VIEW  COMPARISON:  Plain films 08/09/2013 at 1725 hr.  FINDINGS: The patient is in a fiberglass splint. Distal radius fracture is again identified. Dorsal angulation is improved. There is continued dorsal displacement and impaction. No new abnormality is identified.  IMPRESSION: Improved position and alignment of the patient's distal radius fracture. No new abnormality.   Electronically Signed   By: Drusilla Kanner M.D.   On: 08/09/2013 19:59   Dg Wrist Complete Left 08/09/2013   CLINICAL DATA:  Status post fall, history of previous left wrist fracture.  EXAM: LEFT WRIST - COMPLETE 3+ VIEW  COMPARISON:  None.  FINDINGS: The patient has sustained a comminuted impacted angulated fracture of the distal left radial metaphysis. There is deformity of the distal ulna which may be old. The carpal bones appear intact as do the  metacarpals. There is mild diffuse osteopenia. There is a large amount of overlying soft tissue swelling.  IMPRESSION: The patient has sustained an acute comminuted angulated fracture of the distal left radial metaphysis. No intra-articular component is demonstrated. Old deformity of the distal ulna is present without definite evidence of an acute ulnar fracture.   Electronically Signed   By: David  [place]   On: 08/09/2013 17:36     1815:  T/C to Hand Surgery Dr. Amanda Pea, case discussed, including:  HPI, pertinent PM/SHx, VS/PE, dx testing, ED course and treatment:  Agreeable to  come to ED for evaluation.  1900:  Hand Surgeon Dr. Amanda Pea has evaluated pt in the ED: he has reduced fx, applied splint/sling. Requests EDP to obtain f/u XR wrist, rx percocet/robaxin, and have pt f/u in ofc next week to schedule OR repair.   2030:  2nd xray resulted with fx alignment improved. Pt wants to go home now. Dx and testing d/w pt and family.  Questions answered.  Verb understanding, agreeable to d/c home with outpt f/u.   Laray Anger, DO 08/12/13 1357

## 2015-03-30 ENCOUNTER — Encounter (HOSPITAL_COMMUNITY): Payer: Self-pay

## 2015-03-30 ENCOUNTER — Emergency Department (HOSPITAL_COMMUNITY)
Admission: EM | Admit: 2015-03-30 | Discharge: 2015-03-30 | Disposition: A | Discharge status: Home / Self Care | Payer: Medicare HMO | Attending: Emergency Medicine | Admitting: Emergency Medicine

## 2015-03-30 DIAGNOSIS — R079 Chest pain, unspecified: Secondary | ICD-10-CM | POA: Diagnosis present

## 2015-03-30 DIAGNOSIS — G4700 Insomnia, unspecified: Secondary | ICD-10-CM | POA: Diagnosis not present

## 2015-03-30 DIAGNOSIS — Z79899 Other long term (current) drug therapy: Secondary | ICD-10-CM | POA: Diagnosis not present

## 2015-03-30 DIAGNOSIS — F329 Major depressive disorder, single episode, unspecified: Secondary | ICD-10-CM | POA: Insufficient documentation

## 2015-03-30 DIAGNOSIS — Z8679 Personal history of other diseases of the circulatory system: Secondary | ICD-10-CM | POA: Diagnosis not present

## 2015-03-30 DIAGNOSIS — Z86718 Personal history of other venous thrombosis and embolism: Secondary | ICD-10-CM | POA: Diagnosis not present

## 2015-03-30 DIAGNOSIS — K219 Gastro-esophageal reflux disease without esophagitis: Secondary | ICD-10-CM | POA: Insufficient documentation

## 2015-03-30 DIAGNOSIS — D509 Iron deficiency anemia, unspecified: Secondary | ICD-10-CM | POA: Insufficient documentation

## 2015-03-30 DIAGNOSIS — Z8781 Personal history of (healed) traumatic fracture: Secondary | ICD-10-CM | POA: Diagnosis not present

## 2015-03-30 DIAGNOSIS — F419 Anxiety disorder, unspecified: Secondary | ICD-10-CM | POA: Insufficient documentation

## 2015-03-30 HISTORY — DX: Gastro-esophageal reflux disease without esophagitis: K21.9

## 2015-03-30 HISTORY — DX: Gastric ulcer, unspecified as acute or chronic, without hemorrhage or perforation: K25.9

## 2015-03-30 LAB — CBC
HEMATOCRIT: 24.8 % — AB (ref 36.0–46.0)
Hemoglobin: 7.2 g/dL — ABNORMAL LOW (ref 12.0–15.0)
MCH: 20.9 pg — ABNORMAL LOW (ref 26.0–34.0)
MCHC: 29 g/dL — AB (ref 30.0–36.0)
MCV: 72.1 fL — AB (ref 78.0–100.0)
Platelets: 498 10*3/uL — ABNORMAL HIGH (ref 150–400)
RBC: 3.44 MIL/uL — ABNORMAL LOW (ref 3.87–5.11)
RDW: 17.8 % — AB (ref 11.5–15.5)
WBC: 7.8 10*3/uL (ref 4.0–10.5)

## 2015-03-30 LAB — URINALYSIS, ROUTINE W REFLEX MICROSCOPIC
BILIRUBIN URINE: NEGATIVE
Glucose, UA: NEGATIVE mg/dL
Hgb urine dipstick: NEGATIVE
Ketones, ur: NEGATIVE mg/dL
LEUKOCYTES UA: NEGATIVE
NITRITE: POSITIVE — AB
PH: 5.5 (ref 5.0–8.0)
Protein, ur: NEGATIVE mg/dL
SPECIFIC GRAVITY, URINE: 1.009 (ref 1.005–1.030)
UROBILINOGEN UA: 0.2 mg/dL (ref 0.0–1.0)

## 2015-03-30 LAB — COMPREHENSIVE METABOLIC PANEL
ALT: 9 U/L — AB (ref 14–54)
ANION GAP: 13 (ref 5–15)
AST: 17 U/L (ref 15–41)
Albumin: 4 g/dL (ref 3.5–5.0)
Alkaline Phosphatase: 75 U/L (ref 38–126)
BUN: 27 mg/dL — AB (ref 6–20)
CALCIUM: 9.5 mg/dL (ref 8.9–10.3)
CHLORIDE: 107 mmol/L (ref 101–111)
CO2: 19 mmol/L — AB (ref 22–32)
Creatinine, Ser: 1.5 mg/dL — ABNORMAL HIGH (ref 0.44–1.00)
GFR calc Af Amer: 44 mL/min — ABNORMAL LOW (ref >60)
GFR, EST NON AFRICAN AMERICAN: 38 mL/min — AB (ref >60)
Glucose, Bld: 99 mg/dL (ref 65–99)
Potassium: 3.5 mmol/L (ref 3.5–5.1)
Sodium: 139 mmol/L (ref 135–145)
TOTAL PROTEIN: 7.7 g/dL (ref 6.5–8.1)
Total Bilirubin: 0.2 mg/dL — ABNORMAL LOW (ref 0.3–1.2)

## 2015-03-30 LAB — I-STAT TROPONIN, ED: TROPONIN I, POC: 0 ng/mL (ref 0.00–0.08)

## 2015-03-30 LAB — LIPASE, BLOOD: Lipase: 28 U/L (ref 22–51)

## 2015-03-30 LAB — URINE MICROSCOPIC-ADD ON

## 2015-03-30 MED ORDER — GI COCKTAIL ~~LOC~~
30.0000 mL | Freq: Once | ORAL | Status: AC
  Administered 2015-03-30: 30 mL via ORAL
  Filled 2015-03-30: qty 30

## 2015-03-30 MED ORDER — FERROUS SULFATE 325 (65 FE) MG PO TABS: 325.0000 mg | ORAL_TABLET | Freq: Two times a day (BID) | ORAL | Status: AC

## 2015-03-30 NOTE — Discharge Instructions (Signed)
Iron supplements as prescribed.  Follow-up with gastroenterology to discuss a possible endoscopy/colonoscopy for further evaluation of your discomfort.   Anemia, Nonspecific Anemia is a condition in which the concentration of red blood cells or hemoglobin in the blood is below normal. Hemoglobin is a substance in red blood cells that carries oxygen to the tissues of the body. Anemia results in not enough oxygen reaching these tissues.  CAUSES  Common causes of anemia include:   Excessive bleeding. Bleeding may be internal or external. This includes excessive bleeding from periods (in women) or from the intestine.   Poor nutrition.   Chronic kidney, thyroid, and liver disease.  Bone marrow disorders that decrease red blood cell production.  Cancer and treatments for cancer.  HIV, AIDS, and their treatments.  Spleen problems that increase red blood cell destruction.  Blood disorders.  Excess destruction of red blood cells due to infection, medicines, and autoimmune disorders. SIGNS AND SYMPTOMS   Minor weakness.   Dizziness.   Headache.  Palpitations.   Shortness of breath, especially with exercise.   Paleness.  Cold sensitivity.  Indigestion.  Nausea.  Difficulty sleeping.  Difficulty concentrating. Symptoms may occur suddenly or they may develop slowly.  DIAGNOSIS  Additional blood tests are often needed. These help your health care provider determine the best treatment. Your health care provider will check your stool for blood and look for other causes of blood loss.  TREATMENT  Treatment varies depending on the cause of the anemia. Treatment can include:   Supplements of iron, vitamin B12, or folic acid.   Hormone medicines.   A blood transfusion. This may be needed if blood loss is severe.   Hospitalization. This may be needed if there is significant continual blood loss.   Dietary changes.  Spleen removal. HOME CARE INSTRUCTIONS Keep  all follow-up appointments. It often takes many weeks to correct anemia, and having your health care provider check on your condition and your response to treatment is very important. SEEK IMMEDIATE MEDICAL CARE IF:   You develop extreme weakness, shortness of breath, or chest pain.   You become dizzy or have trouble concentrating.  You develop heavy vaginal bleeding.   You develop a rash.   You have bloody or black, tarry stools.   You faint.   You vomit up blood.   You vomit repeatedly.   You have abdominal pain.  You have a fever or persistent symptoms for more than 2-3 days.   You have a fever and your symptoms suddenly get worse.   You are dehydrated.  MAKE SURE YOU:  Understand these instructions.  Will watch your condition.  Will get help right away if you are not doing well or get worse. Document Released: 10/21/2004 Document Revised: 05/16/2013 Document Reviewed: 03/09/2013 [facility] Patient Information 2015 [employer], [place]. This information is not intended to replace advice given to you by your health care provider. Make sure you discuss any questions you have with your health care provider.

## 2015-03-30 NOTE — ED Notes (Signed)
Pt has a history of GERD and takes Nexium.  She doesn't feel like her symptoms today are cardiac related but the discomfort is worse than normal.  She has been having heaviness in her chest that radiates to her throat and jaw.  She does have a family history of cardiac disease.    Emesis over last 24 hours - 6 times Denies fever or diarrhea.

## 2015-03-30 NOTE — ED Provider Notes (Signed)
CSN: [id number]     Arrival date & time 03/30/15  1308 History   First MD Initiated Contact with Patient 03/30/15 1407     Chief Complaint  Patient presents with  . Chest Pain     (Consider location/radiation/quality/duration/timing/severity/associated sxs/prior Treatment) HPI Comments: Patient is a [age] year old female who presents with complaints of intermittent sharp pains to the front of her chest for the past 2 weeks. She denies any fevers or chills. She denies any shortness of breath, nausea, or radiation to the arm or jaw.  Patient is a 59 y.o. female presenting with chest pain. The history is provided by the patient.  Chest Pain Pain location:  Substernal area Pain quality: burning   Pain radiates to:  Does not radiate Pain radiates to the back: no   Pain severity:  Moderate Onset quality:  Gradual Timing:  Intermittent Progression:  Worsening Chronicity:  New Relieved by:  Nothing Worsened by:  Nothing tried Ineffective treatments:  None tried   Past Medical History  Diagnosis Date  . Hernia   . Ulcer   . Depression   . Anxiety   . Insomnia   . High cholesterol   . Hypotension   . DVT (deep vein thrombosis) in pregnancy   . Humerus fracture   . Frequent falls   . GERD (gastroesophageal reflux disease)   . Stomach ulcer    Past Surgical History  Procedure Laterality Date  . Arm surgery    . Abdominal hysterectomy    . Tubal ligation    . Knee surgery    . Back surgery    . Foot surgery    . Greenfield filter placement      has had blood clots in leg   Family History  Problem Relation Age of Onset  . Hyperlipidemia Other   . Stroke Other   . Heart attack Other   . Cancer Other   . Hypertension Other    History  Substance Use Topics  . Smoking status: Never Smoker   . Smokeless tobacco: Not on file  . Alcohol Use: No   OB History    No data available     Review of Systems  Cardiovascular: Positive for chest pain.  All other systems reviewed  and are negative.     Allergies  Review of patient's allergies indicates no known allergies.  Home Medications   Prior to Admission medications   Medication Sig Start Date End Date Taking? Authorizing Provider  acetaminophen (TYLENOL) 325 MG tablet Take 650 mg by mouth every 6 (six) hours as needed. Pain.   Yes Historical Provider, MD  Aspirin-Acetaminophen (GOODYS BODY PAIN PO) Take 1 packet by mouth 2 (two) times daily as needed (for pain).    Yes Historical Provider, MD  calcium-vitamin D (OSCAL WITH D) 500-200 MG-UNIT per tablet Take 1 tablet by mouth daily with breakfast.   Yes Historical Provider, MD  citalopram (CELEXA) 40 MG tablet Take 40 mg by mouth daily.   Yes Historical Provider, MD  clonazePAM (KLONOPIN) 1 MG tablet Take 1 mg by mouth 3 (three) times daily. Take 1.5 tablet by mouth twice daily and 0.5 tablet at bedtime.   Yes Historical Provider, MD  esomeprazole (NEXIUM) 20 MG capsule Take 20 mg by mouth 2 (two) times daily before a meal.   Yes Historical Provider, MD  glucosamine-chondroitin 500-400 MG tablet Take 1 tablet by mouth daily.   Yes Historical Provider, MD  zolpidem (AMBIEN) 10 MG tablet Take 1  tablet by mouth at bedtime as needed. sleep 03/20/15  Yes Historical Provider, MD   BP 108/85 mmHg  Pulse 80  Temp(Src) 98 F (36.7 C) (Oral)  Resp 20  SpO2 100% Physical Exam  Constitutional: She is oriented to person, place, and time. She appears well-developed and well-nourished. No distress.  HENT:  Head: Normocephalic and atraumatic.  Neck: Normal range of motion. Neck supple.  Cardiovascular: Normal rate and regular rhythm.  Exam reveals no gallop and no friction rub.   No murmur heard. Pulmonary/Chest: Effort normal and breath sounds normal. No respiratory distress. She has no wheezes.  Abdominal: Soft. Bowel sounds are normal. She exhibits no distension. There is no tenderness.  Musculoskeletal: Normal range of motion.  Neurological: She is alert and  oriented to person, place, and time.  Skin: Skin is warm and dry. She is not diaphoretic.  Nursing note and vitals reviewed.   ED Course  Procedures (including critical care time) Labs Review Labs Reviewed  CBC - Abnormal; Notable for the following:    RBC 3.44 (*)    Hemoglobin 7.2 (*)    HCT 24.8 (*)    MCV 72.1 (*)    MCH 20.9 (*)    MCHC 29.0 (*)    RDW 17.8 (*)    Platelets 498 (*)    All other components within normal limits  COMPREHENSIVE METABOLIC PANEL - Abnormal; Notable for the following:    CO2 19 (*)    BUN 27 (*)    Creatinine, Ser 1.50 (*)    ALT 9 (*)    Total Bilirubin 0.2 (*)    GFR calc non Af Amer 38 (*)    GFR calc Af Amer 44 (*)    All other components within normal limits  URINALYSIS, ROUTINE W REFLEX MICROSCOPIC (NOT AT [facility]) - Abnormal; Notable for the following:    APPearance CLOUDY (*)    Nitrite POSITIVE (*)    All other components within normal limits  URINE MICROSCOPIC-ADD ON - Abnormal; Notable for the following:    Squamous Epithelial / LPF FEW (*)    Bacteria, UA MANY (*)    All other components within normal limits  LIPASE, BLOOD  I-STAT TROPOININ, ED  POC OCCULT BLOOD, ED    Imaging Review No results found.   EKG Interpretation   Date/Time:  Sunday March 30 2015 13:21:31 EDT Ventricular Rate:  114 PR Interval:  124 QRS Duration: 74 QT Interval:  300 QTC Calculation: 413 R Axis:   -4 Text Interpretation:  Sinus tachycardia Low voltage, precordial leads  Confirmed by Izack Hoogland  MD, Kearie Mennen ([id number]) on 03/30/2015 2:08:58 PM      MDM   Final diagnoses:  None    Workup reveals no evidence for acute cardiac etiology. Her EKG is unchanged and troponin is negative. Remainder of workup is unremarkable with the exception of a hemoglobin of 7.2. She is heme-negative and is hemodynamically stable. She has an MCV of 72 which would be consistent with an iron deficiency. I doubt this is an acute bleed. She will be treated with iron and  follow-up with gastroenterology to discuss a possible endoscopy.  Geoffery Lyons, MD 03/30/15 [phone]

## 2015-03-30 NOTE — ED Notes (Signed)
Pt c/o constant chest "heaviness" and intermittent sharp chest pain x 2 weeks and intermittent n/v/d x 4 years.  Pain score 7/10.  Pt reports GI "issues," GERD, hiatal hernia, and stomach ulcer.

## 2015-10-19 ENCOUNTER — Emergency Department (HOSPITAL_COMMUNITY)
Admission: EM | Admit: 2015-10-19 | Discharge: 2015-10-20 | Disposition: A | Discharge status: Home / Self Care | Payer: Medicare HMO | Attending: Emergency Medicine | Admitting: Emergency Medicine

## 2015-10-19 ENCOUNTER — Encounter (HOSPITAL_COMMUNITY): Payer: Self-pay | Admitting: *Deleted

## 2015-10-19 ENCOUNTER — Emergency Department (HOSPITAL_COMMUNITY): Payer: Medicare HMO

## 2015-10-19 DIAGNOSIS — R52 Pain, unspecified: Secondary | ICD-10-CM | POA: Diagnosis present

## 2015-10-19 DIAGNOSIS — Z79899 Other long term (current) drug therapy: Secondary | ICD-10-CM | POA: Diagnosis not present

## 2015-10-19 DIAGNOSIS — Z8679 Personal history of other diseases of the circulatory system: Secondary | ICD-10-CM | POA: Diagnosis not present

## 2015-10-19 DIAGNOSIS — Z7982 Long term (current) use of aspirin: Secondary | ICD-10-CM | POA: Diagnosis not present

## 2015-10-19 DIAGNOSIS — Z8639 Personal history of other endocrine, nutritional and metabolic disease: Secondary | ICD-10-CM | POA: Diagnosis not present

## 2015-10-19 DIAGNOSIS — F419 Anxiety disorder, unspecified: Secondary | ICD-10-CM | POA: Diagnosis not present

## 2015-10-19 DIAGNOSIS — G4700 Insomnia, unspecified: Secondary | ICD-10-CM | POA: Diagnosis not present

## 2015-10-19 DIAGNOSIS — K219 Gastro-esophageal reflux disease without esophagitis: Secondary | ICD-10-CM | POA: Insufficient documentation

## 2015-10-19 DIAGNOSIS — F329 Major depressive disorder, single episode, unspecified: Secondary | ICD-10-CM | POA: Insufficient documentation

## 2015-10-19 DIAGNOSIS — Z8781 Personal history of (healed) traumatic fracture: Secondary | ICD-10-CM | POA: Insufficient documentation

## 2015-10-19 DIAGNOSIS — J159 Unspecified bacterial pneumonia: Secondary | ICD-10-CM | POA: Insufficient documentation

## 2015-10-19 DIAGNOSIS — J189 Pneumonia, unspecified organism: Secondary | ICD-10-CM

## 2015-10-19 MED ORDER — DEXAMETHASONE 4 MG PO TABS
12.0000 mg | ORAL_TABLET | Freq: Once | ORAL | Status: AC
  Administered 2015-10-19: 12 mg via ORAL
  Filled 2015-10-19: qty 3

## 2015-10-19 MED ORDER — IPRATROPIUM-ALBUTEROL 0.5-2.5 (3) MG/3ML IN SOLN
3.0000 mL | Freq: Once | RESPIRATORY_TRACT | Status: AC
  Administered 2015-10-19: 3 mL via RESPIRATORY_TRACT
  Filled 2015-10-19: qty 3

## 2015-10-19 MED ORDER — AMOXICILLIN 500 MG PO CAPS
1000.0000 mg | ORAL_CAPSULE | Freq: Once | ORAL | Status: AC
  Administered 2015-10-19: 1000 mg via ORAL
  Filled 2015-10-19: qty 2

## 2015-10-19 NOTE — ED Notes (Signed)
The pt is c/o  A cold sorethroat headache chest congestion   cough productive green mucous for one  Week.  She also has some increased diff  Breathing after coughng

## 2015-10-19 NOTE — ED Provider Notes (Signed)
CSN: [id number]     Arrival date & time 10/19/15  2055 History   By signing my name below, I, Christina [name], attest that this documentation has been prepared under the direction and in the presence of Christina Booze, MD.  Electronically Signed: Arlan [name], ED Scribe. 10/19/2015. 11:27 PM.   Chief Complaint  Patient presents with  . Generalized Body Aches   The history is provided by the patient. No language interpreter was used.    HPI Comments: Christina [name] is a 60 y.o. female who presents to the Emergency Department complaining of constant, ongoing generalized body aches x 1 week. She also reports productive cough consisting of yellow mucous, sore throat, HA, congestion, chills, mild shortness of breath, and chest discomfort. No aggravating or alleviating factors at this time. No OTC medications or home remedies attempted prior to arrival. No recent fever, nausea, or vomiting. No known sick contacts. Ms. Christina [name] did receive her Flu vaccine this season. No known allergies to medications.  PCP: [name], MD    Past Medical History  Diagnosis Date  . Hernia   . Ulcer   . Depression   . Anxiety   . Insomnia   . High cholesterol   . Hypotension   . DVT (deep vein thrombosis) in pregnancy   . Humerus fracture   . Frequent falls   . GERD (gastroesophageal reflux disease)   . Stomach ulcer    Past Surgical History  Procedure Laterality Date  . Arm surgery    . Abdominal hysterectomy    . Tubal ligation    . Knee surgery    . Back surgery    . Foot surgery    . Greenfield filter placement      has had blood clots in leg   Family History  Problem Relation Age of Onset  . Hyperlipidemia Other   . Stroke Other   . Heart attack Other   . Cancer Other   . Hypertension Other    Social History  Substance Use Topics  . Smoking status: Never Smoker   . Smokeless tobacco: None  . Alcohol Use: No   OB History    No data available     Review of Systems  Constitutional:  Positive for chills. Negative for fever.  HENT: Positive for congestion.   Respiratory: Positive for cough and shortness of breath.   Cardiovascular: Positive for chest pain.  Gastrointestinal: Negative for nausea, vomiting, abdominal pain and diarrhea.  Musculoskeletal: Positive for myalgias.  Neurological: Positive for headaches.  Psychiatric/Behavioral: Negative for confusion.  All other systems reviewed and are negative.     Allergies  Review of patient's allergies indicates no known allergies.  Home Medications   Prior to Admission medications   Medication Sig Start Date End Date Taking? Authorizing Provider  acetaminophen (TYLENOL) 325 MG tablet Take 650 mg by mouth every 6 (six) hours as needed. Pain.    Historical Provider, MD  Aspirin-Acetaminophen (GOODYS BODY PAIN PO) Take 1 packet by mouth 2 (two) times daily as needed (for pain).     Historical Provider, MD  calcium-vitamin D (OSCAL WITH D) 500-200 MG-UNIT per tablet Take 1 tablet by mouth daily with breakfast.    Historical Provider, MD  citalopram (CELEXA) 40 MG tablet Take 40 mg by mouth daily.    Historical Provider, MD  clonazePAM (KLONOPIN) 1 MG tablet Take 1 mg by mouth 3 (three) times daily. Take 1.5 tablet by mouth twice daily and 0.5 tablet at bedtime.  Historical Provider, MD  esomeprazole (NEXIUM) 20 MG capsule Take 20 mg by mouth 2 (two) times daily before a meal.    Historical Provider, MD  ferrous sulfate 325 (65 FE) MG tablet Take 1 tablet (325 mg total) by mouth 2 (two) times daily with a meal. 03/30/15   Geoffery Lyons, MD  glucosamine-chondroitin 500-400 MG tablet Take 1 tablet by mouth daily.    Historical Provider, MD  zolpidem (AMBIEN) 10 MG tablet Take 1 tablet by mouth at bedtime as needed. sleep 03/20/15   Historical Provider, MD   Triage Vitals: BP 97/64 mmHg  Pulse 90  Temp(Src) 97.9 F (36.6 C) (Oral)  Resp 18  SpO2 94%   Physical Exam  Constitutional: She is oriented to person, place, and  time. She appears well-developed and well-nourished. No distress.  HENT:  Head: Normocephalic and atraumatic.  Eyes: EOM are normal. Pupils are equal, round, and reactive to light.  Neck: Normal range of motion. Neck supple. No JVD present.  Cardiovascular: Normal rate, regular rhythm and normal heart sounds.   No murmur heard. Pulmonary/Chest: Effort normal. She has wheezes. She has rales. She exhibits no tenderness.  Rales at the L base Slight wheeze noted with cough  Abdominal: Soft. Bowel sounds are normal. She exhibits no distension and no mass. There is no tenderness.  Musculoskeletal: Normal range of motion. She exhibits no edema.  Lymphadenopathy:    She has no cervical adenopathy.  Neurological: She is alert and oriented to person, place, and time. No cranial nerve deficit. She exhibits normal muscle tone. Coordination normal.  Skin: Skin is warm and dry. No rash noted.  Psychiatric: She has a normal mood and affect. Her behavior is normal. Judgment and thought content normal.  Nursing note and vitals reviewed.   ED Course  Procedures (including critical care time)  DIAGNOSTIC STUDIES: Oxygen Saturation is 94% on RA, adequate by my interpretation.    COORDINATION OF CARE: 11:26 PM- Will give breathing treatment and Decadron. Will order CXR. Discussed treatment plan with pt at bedside and pt agreed to plan.    Imaging Review Dg Chest 2 View  10/19/2015  CLINICAL DATA:  Acute onset of wheezing, cough and shortness of breath. Initial encounter. EXAM: CHEST  2 VIEW COMPARISON:  Chest radiograph from 06/24/2010 FINDINGS: The lungs are well-aerated. Mild left basilar opacity raises concern for pneumonia. Mild peribronchial thickening is seen. There is no evidence of pleural effusion or pneumothorax. The heart is normal in size; the mediastinal contour is within normal limits. No acute osseous abnormalities are seen. The patient is status vertebroplasty at multiple levels along the  mid thoracic spine, and chronic compression deformities are noted at the upper lumbar spine. An IVC filter is noted. IMPRESSION: Mild left basilar airspace opacity raises concern for pneumonia. Mild peribronchial thickening seen. Followup PA and lateral chest X-ray is recommended in 3-4 weeks following trial of antibiotic therapy to ensure resolution and exclude underlying malignancy. Electronically Signed   By: Roanna Raider M.D.   On: 10/19/2015 21:53   I have personally reviewed and evaluated these images as part of my medical decision-making.   MDM   Final diagnoses:  Community acquired pneumonia    Community-acquired pneumonia. She is given nebulizer treatment with albuterol and ipratropium with significant subjective improvement but persistence of her cough. She is also given dose of dexamethasone and initial dose of amoxicillin. She is discharged with prescriptions for amoxicillin, albuterol inhaler, and a small number of hydrocodone-acetaminophen to use  to help suppress cough at night so she can sleep. Old records were reviewed and there are no relevant past visits.  I personally performed the services described in this documentation, which was scribed in my presence. The recorded information has been reviewed and is accurate.      Christina Booze, MD 10/20/15 0040

## 2015-10-20 MED ORDER — HYDROCODONE-ACETAMINOPHEN 5-325 MG PO TABS: 1.0000 | ORAL_TABLET | ORAL | Status: AC | PRN

## 2015-10-20 MED ORDER — AMOXICILLIN 500 MG PO CAPS: 1000.0000 mg | ORAL_CAPSULE | Freq: Two times a day (BID) | ORAL | Status: AC

## 2015-10-20 MED ORDER — ALBUTEROL SULFATE HFA 108 (90 BASE) MCG/ACT IN AERS: 2.0000 | INHALATION_SPRAY | RESPIRATORY_TRACT | Status: AC | PRN

## 2015-10-20 MED ORDER — HYDROCODONE-ACETAMINOPHEN 5-325 MG PO TABS
1.0000 | ORAL_TABLET | Freq: Once | ORAL | Status: AC
  Administered 2015-10-20: 1 via ORAL
  Filled 2015-10-20: qty 1

## 2015-10-20 NOTE — Discharge Instructions (Signed)
Only use the hydrocodone-acetaminophen at night, so the cough doesn't keep you from sleeping.  Community-Acquired Pneumonia, Adult Pneumonia is an infection of the lungs. There are different types of pneumonia. One type can develop while a person is in a hospital. A different type, called community-acquired pneumonia, develops in people who are not, or have not recently been, in the hospital or other health care facility.  CAUSES Pneumonia may be caused by bacteria, viruses, or funguses. Community-acquired pneumonia is often caused by Streptococcus pneumonia bacteria. These bacteria are often passed from one person to another by breathing in droplets from the cough or sneeze of an infected person. RISK FACTORS The condition is more likely to develop in:  People who havechronic diseases, such as chronic obstructive pulmonary disease (COPD), asthma, congestive heart failure, cystic fibrosis, diabetes, or kidney disease.  People who haveearly-stage or late-stage HIV.  People who havesickle cell disease.  People who havehad their spleen removed (splenectomy).  People who havepoor Administrator.  People who havemedical conditions that increase the risk of breathing in (aspirating) secretions their own mouth and nose.   People who havea weakened immune system (immunocompromised).  People who smoke.  People whotravel to areas where pneumonia-causing germs commonly exist.  People whoare around animal habitats or animals that have pneumonia-causing germs, including birds, bats, rabbits, cats, and farm animals. SYMPTOMS Symptoms of this condition include:  Adry cough.  A wet (productive) cough.  Fever.  Sweating.  Chest pain, especially when breathing deeply or coughing.  Rapid breathing or difficulty breathing.  Shortness of breath.  Shaking chills.  Fatigue.  Muscle aches. DIAGNOSIS Your health care provider will take a medical history and perform a physical  exam. You may also have other tests, including:  Imaging studies of your chest, including X-rays.  Tests to check your blood oxygen level and other blood gases.  Other tests on blood, mucus (sputum), fluid around your lungs (pleural fluid), and urine. If your pneumonia is severe, other tests may be done to identify the specific cause of your illness. TREATMENT The type of treatment that you receive depends on many factors, such as the cause of your pneumonia, the medicines you take, and other medical conditions that you have. For most adults, treatment and recovery from pneumonia may occur at home. In some cases, treatment must happen in a hospital. Treatment may include:  Antibiotic medicines, if the pneumonia was caused by bacteria.  Antiviral medicines, if the pneumonia was caused by a virus.  Medicines that are given by mouth or through an IV tube.  Oxygen.  Respiratory therapy. Although rare, treating severe pneumonia may include:  Mechanical ventilation. This is done if you are not breathing well on your own and you cannot maintain a safe blood oxygen level.  Thoracentesis. This procedureremoves fluid around one lung or both lungs to help you breathe better. HOME CARE INSTRUCTIONS  Take over-the-counter and prescription medicines only as told by your health care provider.  Only takecough medicine if you are losing sleep. Understand that cough medicine can prevent your body's natural ability to remove mucus from your lungs.  If you were prescribed an antibiotic medicine, take it as told by your health care provider. Do not stop taking the antibiotic even if you start to feel better.  Sleep in a semi-upright position at night. Try sleeping in a reclining chair, or place a few pillows under your head.  Do not use tobacco products, including cigarettes, chewing tobacco, and e-cigarettes. If you need  help quitting, ask your health care provider.  Drink enough water to keep  your urine clear or pale yellow. This will help to thin out mucus secretions in your lungs. PREVENTION There are ways that you can decrease your risk of developing community-acquired pneumonia. Consider getting a pneumococcal vaccine if:  You are older than 58 years of age.  You are older than 58 years of age and are undergoing cancer treatment, have chronic lung disease, or have other medical conditions that affect your immune system. Ask your health care provider if this applies to you. There are different types and schedules of pneumococcal vaccines. Ask your health care provider which vaccination option is best for you. You may also prevent community-acquired pneumonia if you take these actions:  Get an influenza vaccine every year. Ask your health care provider which type of influenza vaccine is best for you.  Go to the dentist on a regular basis.  Wash your hands often. Use hand sanitizer if soap and water are not available. SEEK MEDICAL CARE IF:  You have a fever.  You are losing sleep because you cannot control your cough with cough medicine. SEEK IMMEDIATE MEDICAL CARE IF:  You have worsening shortness of breath.  You have increased chest pain.  Your sickness becomes worse, especially if you are an older adult or have a weakened immune system.  You cough up blood.   This information is not intended to replace advice given to you by your health care provider. Make sure you discuss any questions you have with your health care provider.   Document Released: 09/13/2005 Document Revised: 06/04/2015 Document Reviewed: 01/08/2015 Elsevier Interactive Patient Education 2016 [employer]  Amoxicillin capsules or tablets What is this medicine? AMOXICILLIN (a mox i SIL in) is a penicillin antibiotic. It is used to treat certain kinds of bacterial infections. It will not work for colds, flu, or other viral infections. This medicine may be used for other purposes; ask your health  care provider or pharmacist if you have questions. What should I tell my health care provider before I take this medicine? They need to know if you have any of these conditions: -asthma -kidney disease -an unusual or allergic reaction to amoxicillin, other penicillins, cephalosporin antibiotics, other medicines, foods, dyes, or preservatives -pregnant or trying to get pregnant -breast-feeding How should I use this medicine? Take this medicine by mouth with a glass of water. Follow the directions on your prescription label. You may take this medicine with food or on an empty stomach. Take your medicine at regular intervals. Do not take your medicine more often than directed. Take all of your medicine as directed even if you think your are better. Do not skip doses or stop your medicine early. Talk to your pediatrician regarding the use of this medicine in children. While this drug may be prescribed for selected conditions, precautions do apply. Overdosage: If you think you have taken too much of this medicine contact a poison control center or emergency room at once. NOTE: This medicine is only for you. Do not share this medicine with others. What if I miss a dose? If you miss a dose, take it as soon as you can. If it is almost time for your next dose, take only that dose. Do not take double or extra doses. What may interact with this medicine? -amiloride -birth control pills -chloramphenicol -macrolides -probenecid -sulfonamides -tetracyclines This list may not describe all possible interactions. Give your health care provider a list  of all the medicines, herbs, non-prescription drugs, or dietary supplements you use. Also tell them if you smoke, drink alcohol, or use illegal drugs. Some items may interact with your medicine. What should I watch for while using this medicine? Tell your doctor or health care professional if your symptoms do not improve in 2 or 3 days. Take all of the doses of  your medicine as directed. Do not skip doses or stop your medicine early. If you are diabetic, you may get a false positive result for sugar in your urine with certain brands of urine tests. Check with your doctor. Do not treat diarrhea with over-the-counter products. Contact your doctor if you have diarrhea that lasts more than 2 days or if the diarrhea is severe and watery. What side effects may I notice from receiving this medicine? Side effects that you should report to your doctor or health care professional as soon as possible: -allergic reactions like skin rash, itching or hives, swelling of the face, lips, or tongue -breathing problems -dark urine -redness, blistering, peeling or loosening of the skin, including inside the mouth -seizures -severe or watery diarrhea -trouble passing urine or change in the amount of urine -unusual bleeding or bruising -unusually weak or tired -yellowing of the eyes or skin Side effects that usually do not require medical attention (report to your doctor or health care professional if they continue or are bothersome): -dizziness -headache -stomach upset -trouble sleeping This list may not describe all possible side effects. Call your doctor for medical advice about side effects. You may report side effects to FDA at 1-800-FDA-1088. Where should I keep my medicine? Keep out of the reach of children. Store between 68 and 77 degrees F (20 and 25 degrees C). Keep bottle closed tightly. Throw away any unused medicine after the expiration date. NOTE: This sheet is a summary. It may not cover all possible information. If you have questions about this medicine, talk to your doctor, pharmacist, or health care provider.    2016, [employer] Standard. (2007-12-05 14:10:59)  Albuterol inhalation aerosol What is this medicine? ALBUTEROL (al Gaspar Bidding) is a bronchodilator. It helps open up the airways in your lungs to make it easier to breathe. This  medicine is used to treat and to prevent bronchospasm. This medicine may be used for other purposes; ask your health care provider or pharmacist if you have questions. What should I tell my health care provider before I take this medicine? They need to know if you have any of the following conditions: -diabetes -heart disease or irregular heartbeat -high blood pressure -pheochromocytoma -seizures -thyroid disease -an unusual or allergic reaction to albuterol, levalbuterol, sulfites, other medicines, foods, dyes, or preservatives -pregnant or trying to get pregnant -breast-feeding How should I use this medicine? This medicine is for inhalation through the mouth. Follow the directions on your prescription label. Take your medicine at regular intervals. Do not use more often than directed. Make sure that you are using your inhaler correctly. Ask you doctor or health care provider if you have any questions. Talk to your pediatrician regarding the use of this medicine in children. Special care may be needed. Overdosage: If you think you have taken too much of this medicine contact a poison control center or emergency room at once. NOTE: This medicine is only for you. Do not share this medicine with others. What if I miss a dose? If you miss a dose, use it as soon as you can. If it is  almost time for your next dose, use only that dose. Do not use double or extra doses. What may interact with this medicine? -anti-infectives like chloroquine and pentamidine -caffeine -cisapride -diuretics -medicines for colds -medicines for depression or for emotional or psychotic conditions -medicines for weight loss including some herbal products -methadone -some antibiotics like clarithromycin, erythromycin, levofloxacin, and linezolid -some heart medicines -steroid hormones like dexamethasone, cortisone, hydrocortisone -theophylline -thyroid hormones This list may not describe all possible interactions.  Give your health care provider a list of all the medicines, herbs, non-prescription drugs, or dietary supplements you use. Also tell them if you smoke, drink alcohol, or use illegal drugs. Some items may interact with your medicine. What should I watch for while using this medicine? Tell your doctor or health care professional if your symptoms do not improve. Do not use extra albuterol. If your asthma or bronchitis gets worse while you are using this medicine, call your doctor right away. If your mouth gets dry try chewing sugarless gum or sucking hard candy. Drink water as directed. What side effects may I notice from receiving this medicine? Side effects that you should report to your doctor or health care professional as soon as possible: -allergic reactions like skin rash, itching or hives, swelling of the face, lips, or tongue -breathing problems -chest pain -feeling faint or lightheaded, falls -high blood pressure -irregular heartbeat -fever -muscle cramps or weakness -pain, tingling, numbness in the hands or feet -vomiting Side effects that usually do not require medical attention (report to your doctor or health care professional if they continue or are bothersome): -cough -difficulty sleeping -headache -nervousness or trembling -stomach upset -stuffy or runny nose -throat irritation -unusual taste This list may not describe all possible side effects. Call your doctor for medical advice about side effects. You may report side effects to FDA at 1-800-FDA-1088. Where should I keep my medicine? Keep out of the reach of children. Store at room temperature between 15 and 30 degrees C (59 and 86 degrees F). The contents are under pressure and may burst when exposed to heat or flame. Do not freeze. This medicine does not work as well if it is too cold. Throw away any unused medicine after the expiration date. Inhalers need to be thrown away after the labeled number of puffs have been  used or by the expiration date; whichever comes first. Ventolin HFA should be thrown away 12 months after removing from foil pouch. Check the instructions that come with your medicine. NOTE: This sheet is a summary. It may not cover all possible information. If you have questions about this medicine, talk to your doctor, pharmacist, or health care provider.    2016, Else[employer] Standard. (2013-03-01 10:57:17)  Acetaminophen; Hydrocodone tablets or capsules What is this medicine? ACETAMINOPHEN; HYDROCODONE (a set a MEE noe fen; hye droe KOE done) is a pain reliever. It is used to treat moderate to severe pain. This medicine may be used for other purposes; ask your health care provider or pharmacist if you have questions. What should I tell my health care provider before I take this medicine? They need to know if you have any of these conditions: -brain tumor -Crohn's disease, inflammatory bowel disease, or ulcerative colitis -drug abuse or addiction -head injury -heart or circulation problems -if you often drink alcohol -kidney disease or problems going to the bathroom -liver disease -lung disease, asthma, or breathing problems -an unusual or allergic reaction to acetaminophen, hydrocodone, other opioid analgesics, other medicines, foods, dyes,  or preservatives -pregnant or trying to get pregnant -breast-feeding How should I use this medicine? Take this medicine by mouth. Swallow it with a full glass of water. Follow the directions on the prescription label. If the medicine upsets your stomach, take the medicine with food or milk. Do not take more than you are told to take. Talk to your pediatrician regarding the use of this medicine in children. This medicine is not approved for use in children. Patients over 65 years may have a stronger reaction and need a smaller dose. Overdosage: If you think you have taken too much of this medicine contact a poison control center or emergency room  at once. NOTE: This medicine is only for you. Do not share this medicine with others. What if I miss a dose? If you miss a dose, take it as soon as you can. If it is almost time for your next dose, take only that dose. Do not take double or extra doses. What may interact with this medicine? -alcohol -antihistamines -isoniazid -medicines for depression, anxiety, or psychotic disturbances -medicines for sleep -muscle relaxants -naltrexone -narcotic medicines (opiates) for pain -phenobarbital -ritonavir -tramadol This list may not describe all possible interactions. Give your health care provider a list of all the medicines, herbs, non-prescription drugs, or dietary supplements you use. Also tell them if you smoke, drink alcohol, or use illegal drugs. Some items may interact with your medicine. What should I watch for while using this medicine? Tell your doctor or health care professional if your pain does not go away, if it gets worse, or if you have new or a different type of pain. You may develop tolerance to the medicine. Tolerance means that you will need a higher dose of the medicine for pain relief. Tolerance is normal and is expected if you take the medicine for a long time. Do not suddenly stop taking your medicine because you may develop a severe reaction. Your body becomes used to the medicine. This does NOT mean you are addicted. Addiction is a behavior related to getting and using a drug for a non-medical reason. If you have pain, you have a medical reason to take pain medicine. Your doctor will tell you how much medicine to take. If your doctor wants you to stop the medicine, the dose will be slowly lowered over time to avoid any side effects. You may get drowsy or dizzy when you first start taking the medicine or change doses. Do not drive, use machinery, or do anything that may be dangerous until you know how the medicine affects you. Stand or sit up slowly. There are different  types of narcotic medicines (opiates) for pain. If you take more than one type at the same time, you may have more side effects. Give your health care provider a list of all medicines you use. Your doctor will tell you how much medicine to take. Do not take more medicine than directed. Call emergency for help if you have problems breathing. The medicine will cause constipation. Try to have a bowel movement at least every 2 to 3 days. If you do not have a bowel movement for 3 days, call your doctor or health care professional. Too much acetaminophen can be very dangerous. Do not take Tylenol (acetaminophen) or medicines that contain acetaminophen with this medicine. Many non-prescription medicines contain acetaminophen. Always read the labels carefully. What side effects may I notice from receiving this medicine? Side effects that you should report to your doctor or  health care professional as soon as possible: -allergic reactions like skin rash, itching or hives, swelling of the face, lips, or tongue -breathing problems -confusion -feeling faint or lightheaded, falls -stomach pain -yellowing of the eyes or skin Side effects that usually do not require medical attention (report to your doctor or health care professional if they continue or are bothersome): -nausea, vomiting -stomach upset This list may not describe all possible side effects. Call your doctor for medical advice about side effects. You may report side effects to FDA at 1-800-FDA-1088. Where should I keep my medicine? Keep out of the reach of children. This medicine can be abused. Keep your medicine in a safe place to protect it from theft. Do not share this medicine with anyone. Selling or giving away this medicine is dangerous and against the law. This medicine may cause accidental overdose and death if it taken by other adults, children, or pets. Mix any unused medicine with a substance like cat litter or coffee grounds. Then throw  the medicine away in a sealed container like a sealed bag or a coffee can with a lid. Do not use the medicine after the expiration date. Store at room temperature between 15 and 30 degrees C (59 and 86 degrees F). NOTE: This sheet is a summary. It may not cover all possible information. If you have questions about this medicine, talk to your doctor, pharmacist, or health care provider.    2016, [employer]vier/[employer] Standard. (2014-08-14 15:29:20)

## 2016-11-25 DEATH — deceased
